# Patient Record
Sex: Female | Born: 1970 | Race: White | Hispanic: No | Smoking: Current every day smoker
Health system: Southern US, Community
[De-identification: ages and names within clinical notes are randomized; demographics above are authoritative.]

## PROBLEM LIST (undated history)

## (undated) DIAGNOSIS — M1990 Unspecified osteoarthritis, unspecified site: Secondary | ICD-10-CM

## (undated) HISTORY — PX: TOTAL HIP ARTHROPLASTY: SHX124

## (undated) HISTORY — PX: JOINT REPLACEMENT: SHX530

## (undated) HISTORY — PX: TUBAL LIGATION: SHX77

---

## 2000-04-21 ENCOUNTER — Encounter: Payer: Self-pay | Admitting: Emergency Medicine

## 2000-04-21 ENCOUNTER — Emergency Department (HOSPITAL_COMMUNITY): Admission: EM | Admit: 2000-04-21 | Discharge: 2000-04-22 | Payer: Self-pay | Admitting: Emergency Medicine

## 2000-11-30 ENCOUNTER — Encounter: Payer: Self-pay | Admitting: *Deleted

## 2000-11-30 ENCOUNTER — Inpatient Hospital Stay (HOSPITAL_COMMUNITY): Admission: AD | Admit: 2000-11-30 | Discharge: 2000-11-30 | Payer: Self-pay | Admitting: *Deleted

## 2000-12-20 ENCOUNTER — Encounter: Payer: Self-pay | Admitting: *Deleted

## 2000-12-20 ENCOUNTER — Inpatient Hospital Stay (HOSPITAL_COMMUNITY): Admission: AD | Admit: 2000-12-20 | Discharge: 2000-12-20 | Payer: Self-pay | Admitting: Obstetrics

## 2001-01-23 ENCOUNTER — Inpatient Hospital Stay (HOSPITAL_COMMUNITY): Admission: AD | Admit: 2001-01-23 | Discharge: 2001-01-23 | Payer: Self-pay | Admitting: Obstetrics

## 2001-01-23 ENCOUNTER — Encounter: Admission: RE | Admit: 2001-01-23 | Discharge: 2001-01-23 | Payer: Self-pay | Admitting: Obstetrics

## 2001-01-27 ENCOUNTER — Encounter: Payer: Self-pay | Admitting: Obstetrics

## 2001-01-27 ENCOUNTER — Ambulatory Visit (HOSPITAL_COMMUNITY): Admission: RE | Admit: 2001-01-27 | Discharge: 2001-01-27 | Payer: Self-pay | Admitting: Obstetrics

## 2001-01-30 ENCOUNTER — Encounter (HOSPITAL_COMMUNITY): Admission: AD | Admit: 2001-01-30 | Discharge: 2001-02-07 | Payer: Self-pay | Admitting: Obstetrics

## 2001-01-30 ENCOUNTER — Encounter: Admission: RE | Admit: 2001-01-30 | Discharge: 2001-01-30 | Payer: Self-pay | Admitting: Obstetrics

## 2001-02-03 ENCOUNTER — Inpatient Hospital Stay (HOSPITAL_COMMUNITY): Admission: AD | Admit: 2001-02-03 | Discharge: 2001-02-03 | Payer: Self-pay | Admitting: Obstetrics

## 2001-02-05 ENCOUNTER — Inpatient Hospital Stay (HOSPITAL_COMMUNITY): Admission: AD | Admit: 2001-02-05 | Discharge: 2001-02-05 | Payer: Self-pay | Admitting: Obstetrics

## 2001-02-06 ENCOUNTER — Encounter: Admission: RE | Admit: 2001-02-06 | Discharge: 2001-02-06 | Payer: Self-pay | Admitting: Obstetrics

## 2001-02-06 ENCOUNTER — Inpatient Hospital Stay (HOSPITAL_COMMUNITY): Admission: AD | Admit: 2001-02-06 | Discharge: 2001-02-08 | Payer: Self-pay | Admitting: Obstetrics & Gynecology

## 2008-10-28 ENCOUNTER — Encounter: Admission: RE | Admit: 2008-10-28 | Discharge: 2008-10-28 | Payer: Self-pay | Admitting: Family Medicine

## 2009-03-22 ENCOUNTER — Emergency Department (HOSPITAL_BASED_OUTPATIENT_CLINIC_OR_DEPARTMENT_OTHER): Admission: EM | Admit: 2009-03-22 | Discharge: 2009-03-22 | Payer: Self-pay | Admitting: Emergency Medicine

## 2009-04-25 ENCOUNTER — Ambulatory Visit: Payer: Self-pay | Admitting: Diagnostic Radiology

## 2009-04-25 ENCOUNTER — Emergency Department (HOSPITAL_BASED_OUTPATIENT_CLINIC_OR_DEPARTMENT_OTHER): Admission: EM | Admit: 2009-04-25 | Discharge: 2009-04-25 | Payer: Self-pay | Admitting: Emergency Medicine

## 2010-10-12 LAB — D-DIMER, QUANTITATIVE: D-Dimer, Quant: <0.22 ug/mL-FEU (ref 0.00–0.48)

## 2010-10-12 LAB — DIFFERENTIAL
Basophils Relative: 3 % — ABNORMAL HIGH (ref 0–1)
Eosinophils Relative: 0 % (ref 0–5)
Lymphs Abs: 2.6 10*3/uL (ref 0.7–4.0)
Monocytes Absolute: 0.7 10*3/uL (ref 0.1–1.0)
Monocytes Relative: 8 % (ref 3–12)
Smear Review: NORMAL

## 2010-10-12 LAB — BASIC METABOLIC PANEL
BUN: 6 mg/dL (ref 6–23)
Chloride: 112 mEq/L (ref 96–112)
GFR calc Af Amer: >60 mL/min (ref >60)
GFR calc non Af Amer: >60 mL/min (ref >60)
Potassium: 3.6 mEq/L (ref 3.5–5.1)
Sodium: 140 mEq/L (ref 135–145)

## 2010-10-12 LAB — CBC
HCT: 44.8 % (ref 36.0–46.0)
MCV: 96.1 fL (ref 78.0–100.0)
Platelets: 326 10*3/uL (ref 150–400)
RBC: 4.67 MIL/uL (ref 3.87–5.11)
WBC: 9.3 10*3/uL (ref 4.0–10.5)

## 2011-11-26 ENCOUNTER — Emergency Department
Admit: 2011-11-26 | Discharge: 2011-11-26 | Disposition: A | Discharge status: Home / Self Care | Payer: PRIVATE HEALTH INSURANCE

## 2011-11-26 ENCOUNTER — Emergency Department (INDEPENDENT_AMBULATORY_CARE_PROVIDER_SITE_OTHER)
Admission: EM | Admit: 2011-11-26 | Discharge: 2011-11-26 | Disposition: A | Discharge status: Home / Self Care | Payer: PRIVATE HEALTH INSURANCE | Source: Home / Self Care | Attending: Family Medicine | Admitting: Family Medicine

## 2011-11-26 ENCOUNTER — Encounter: Payer: Self-pay | Admitting: Emergency Medicine

## 2011-11-26 DIAGNOSIS — J45909 Unspecified asthma, uncomplicated: Secondary | ICD-10-CM

## 2011-11-26 MED ORDER — METHYLPREDNISOLONE SODIUM SUCC 125 MG IJ SOLR
125.0000 mg | Freq: Once | INTRAMUSCULAR | Status: AC
  Administered 2011-11-26: 125 mg via INTRAMUSCULAR

## 2011-11-26 MED ORDER — IPRATROPIUM-ALBUTEROL 0.5-2.5 (3) MG/3ML IN SOLN
3.0000 mL | Freq: Once | RESPIRATORY_TRACT | Status: AC
  Administered 2011-11-26: 3 mL via RESPIRATORY_TRACT

## 2011-11-26 MED ORDER — AMOXICILLIN 875 MG PO TABS: 875.0000 mg | ORAL_TABLET | Freq: Two times a day (BID) | ORAL | Status: AC

## 2011-11-26 MED ORDER — BENZONATATE 200 MG PO CAPS: 200.0000 mg | ORAL_CAPSULE | Freq: Every day | ORAL | Status: AC

## 2011-11-26 MED ORDER — PREDNISONE 20 MG PO TABS: 20.0000 mg | ORAL_TABLET | Freq: Two times a day (BID) | ORAL | Status: AC

## 2011-11-26 NOTE — ED Provider Notes (Addendum)
History     CSN: [id number]  Arrival date & time 11/26/11  1317   First MD Initiated Contact with Patient 11/26/11 1339      Chief Complaint  Patient presents with  . Wheezing      HPI Comments: Patient complains of approximately 4 day history of gradually progressive URI symptoms beginning with   progressive nasal congestion and a cough.  Complains of fatigue but no myalgias.  Cough is now worse at night and generally productive during the day.  There has been no pleuritic pain but she complains of wheezing and shortness of breath with activity.  She started taking some left-over amoxicillin 500mg  without improvement.  She has a history of asthma but normally only needs her albuterol MDI about 1-2 times per week.  She normally does not cough at night.  She has a past history of  Pneumonia several years ago.  She smokes 1/2 pack per day     The history is provided by the patient.    Past Medical History  Diagnosis Date  . Asthma     Past Surgical History  Procedure Date  . Total hip arthroplasty     History reviewed. No pertinent family history.  History  Substance Use Topics  . Smoking status: Current Everyday Smoker -- 0.5 packs/day for 20 years  . Smokeless tobacco: Not on file  . Alcohol Use: No    OB History    Grav Para Term Preterm Abortions TAB SAB Ect Mult Living                  Review of Systems No sore throat + cough No pleuritic pain + wheezing + nasal congestion + post-nasal drainage No sinus pain/pressure No itchy/red eyes No earache No hemoptysis + SOB with activity + low grade fever/chills No nausea No vomiting No abdominal pain No diarrhea No urinary symptoms No skin rashes + fatigue No myalgias No headache Used OTC meds without relief  Allergies  Biaxin  Home Medications   Current Outpatient Rx  Name Route Sig Dispense Refill  . ALBUTEROL SULFATE (2.5 MG/3ML) 0.083% IN NEBU Nebulization Take 2.5 mg by nebulization every 6  (six) hours as needed.    . BUDESONIDE-FORMOTEROL FUMARATE 160-4.5 MCG/ACT IN AERO Inhalation Inhale 2 puffs into the lungs 2 (two) times daily.    Marland Kitchen GABAPENTIN 300 MG PO CAPS Oral Take 300 mg by mouth 3 (three) times daily.    . AMOXICILLIN 875 MG PO TABS Oral Take 1 tablet (875 mg total) by mouth 2 (two) times daily. 14 tablet 0  . BENZONATATE 200 MG PO CAPS Oral Take 1 capsule (200 mg total) by mouth at bedtime. Take as needed for cough 12 capsule 0  . PREDNISONE 20 MG PO TABS Oral Take 1 tablet (20 mg total) by mouth 2 (two) times daily. 10 tablet 0    BP 110/79  Pulse 82  Temp(Src) 98.3 F (36.8 C) (Oral)  Resp 24  Ht 5\' 5"  (1.651 m)  Wt 122 lb (55.339 kg)  BMI 20.30 kg/m2  SpO2 96%  LMP 11/26/2011  Physical Exam Nursing notes and Vital Signs reviewed. Appearance:  Patient appears healthy, stated age, and in no acute distress Eyes:  Pupils are equal, round, and reactive to light and accomodation.  Extraocular movement is intact.  Conjunctivae are not inflamed  Ears:  Canals normal.  Tympanic membranes normal.  Nose:  Mildly congested turbinates.  No sinus tenderness.   Pharynx:  Normal  Neck:  Supple.  Slightly tender shotty posterior nodes are palpated bilaterally  Lungs:  Diffuse wheezes posteriorly.  Breath sounds are equal.  Heart:  Regular rate and rhythm without murmurs, rubs, or gallops.  Abdomen:  Nontender without masses or hepatosplenomegaly.  Bowel sounds are present.  No CVA or flank tenderness.  Extremities:  No edema.  No calf tenderness Skin:  No rash present.   ED Course  Procedures none   Dg Chest 2 View  11/26/2011  *RADIOLOGY REPORT*  Clinical Data: Asthma, cough, wheezing  CHEST - 2 VIEW  Comparison: 04/25/2009  Findings: Hyperinflation again noted.  Cardiomediastinal silhouette is stable.  No acute infiltrate or pleural effusion.  No pulmonary edema.  Bony thorax is stable with  IMPRESSION: No active disease.  Hyperinflation again noted.  Original Report  Authenticated By: Natasha Mead, M.D.     1. Asthmatic bronchitis   Suspect intermittent asthma.    MDM  Given nebulizer treatment with DuoNeb.  Note peak flow 175 post neb treatment Solumedrol 125mg  IM Begin prednisone burst for 5 days.  Continue amoxicillin.  Prescription written for Benzonatate [facility]) to take at bedtime for night-time cough.  Start prednisone on Tuesday 11/27/11. Take plain Mucinex (guaifenesin) twice daily for cough and congestion.  Increase fluid intake, rest. May use Afrin nasal spray (or generic oxymetazoline) twice daily for about 5 days.  Also recommend using saline nasal spray several times daily and saline nasal irrigation (AYR is a common brand) Stop all antihistamines for now, and other non-prescription cough/cold preparations. Continue inhalers. Recommend discontinuing smoking.  Followup with Family Doctor if not improved in 5 days.  Followup with PCP to establish an asthma plan         Lattie Haw, MD 11/27/11 1411  Lattie Haw, MD 11/27/11 [phone]

## 2011-11-26 NOTE — ED Notes (Signed)
Peak flow post Neb: 175

## 2011-11-26 NOTE — ED Notes (Signed)
Wheezing, congestion, cough, runny nose x 4 days

## 2011-11-26 NOTE — Discharge Instructions (Signed)
Start prednisone on Tuesday 11/27/11. Take plain Mucinex (guaifenesin) twice daily for cough and congestion.  Increase fluid intake, rest. May use Afrin nasal spray (or generic oxymetazoline) twice daily for about 5 days.  Also recommend using saline nasal spray several times daily and saline nasal irrigation (AYR is a common brand) Stop all antihistamines for now, and other non-prescription cough/cold preparations. Continue inhalers. Recommend discontinuing smoking.

## 2012-04-29 ENCOUNTER — Encounter (HOSPITAL_BASED_OUTPATIENT_CLINIC_OR_DEPARTMENT_OTHER): Payer: Self-pay | Admitting: *Deleted

## 2012-04-29 ENCOUNTER — Emergency Department (HOSPITAL_BASED_OUTPATIENT_CLINIC_OR_DEPARTMENT_OTHER)
Admission: EM | Admit: 2012-04-29 | Discharge: 2012-04-29 | Disposition: A | Discharge status: Home / Self Care | Payer: Self-pay | Attending: Emergency Medicine | Admitting: Emergency Medicine

## 2012-04-29 ENCOUNTER — Emergency Department (HOSPITAL_BASED_OUTPATIENT_CLINIC_OR_DEPARTMENT_OTHER): Payer: Self-pay

## 2012-04-29 DIAGNOSIS — M25559 Pain in unspecified hip: Secondary | ICD-10-CM | POA: Insufficient documentation

## 2012-04-29 DIAGNOSIS — M25552 Pain in left hip: Secondary | ICD-10-CM

## 2012-04-29 DIAGNOSIS — F17200 Nicotine dependence, unspecified, uncomplicated: Secondary | ICD-10-CM | POA: Insufficient documentation

## 2012-04-29 DIAGNOSIS — J45909 Unspecified asthma, uncomplicated: Secondary | ICD-10-CM | POA: Insufficient documentation

## 2012-04-29 DIAGNOSIS — Z79899 Other long term (current) drug therapy: Secondary | ICD-10-CM | POA: Insufficient documentation

## 2012-04-29 DIAGNOSIS — Z96649 Presence of unspecified artificial hip joint: Secondary | ICD-10-CM | POA: Insufficient documentation

## 2012-04-29 MED ORDER — OXYCODONE-ACETAMINOPHEN 5-325 MG PO TABS: 2.0000 | ORAL_TABLET | ORAL | Status: DC | PRN

## 2012-04-29 NOTE — ED Provider Notes (Signed)
History     CSN: [id number]  Arrival date & time 04/29/12  1129   First MD Initiated Contact with Patient 04/29/12 1237      Chief Complaint  Patient presents with  . Hip Pain    (Consider location/radiation/quality/duration/timing/severity/associated sxs/prior treatment) The history is provided by the patient.  Jenna Adams is a 41 y.o. 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 She noticed that the L hip popped out 3 days ago and she was able to pop it back in. Afterwards, it felt more unstable but she is still able to bear weight on it. No weakness or pain. No falls.    Past Medical History  Diagnosis Date  . Asthma     Past Surgical History  Procedure Date  . Total hip arthroplasty     History reviewed. No pertinent family history.  History  Substance Use Topics  . Smoking status: Current Every Day Smoker -- 0.5 packs/day for 20 years  . Smokeless tobacco: Not on file  . Alcohol Use: No    OB History    Grav Para Term Preterm Abortions TAB SAB Ect Mult Living                  Review of Systems  Musculoskeletal:       L hip pain  All other systems reviewed and are negative.    Allergies  Biaxin  Home Medications   Current Outpatient Rx  Name Route Sig Dispense Refill  . ALBUTEROL SULFATE (2.5 MG/3ML) 0.083% IN NEBU Nebulization Take 2.5 mg by nebulization every 6 (six) hours as needed.    . BUDESONIDE-FORMOTEROL FUMARATE 160-4.5 MCG/ACT IN AERO Inhalation Inhale 2 puffs into the lungs 2 (two) times daily.    Marland Kitchen GABAPENTIN 300 MG PO CAPS Oral Take 300 mg by mouth 3 (three) times daily.    . OXYCODONE-ACETAMINOPHEN 5-325 MG PO TABS Oral Take 2 tablets by mouth every 4 (four) hours as needed for pain. 15 tablet 0    BP 101/59  Pulse 87  Temp 98.1 F (36.7 C) (Oral)  Resp 18  Ht 5\' 5"  (1.651 m)  Wt 132 lb (59.875 kg)  BMI 21.97 kg/m2  SpO2 99%  LMP 04/15/2012  Physical Exam  Nursing note and vitals  reviewed. Constitutional: She is oriented to person, place, and time. She appears well-developed and well-nourished.  HENT:  Head: Normocephalic.  Eyes: Conjunctivae normal and EOM are normal. Pupils are equal, round, and reactive to light.  Neck: Normal range of motion. Neck supple.  Cardiovascular: Normal rate.   Pulmonary/Chest: Effort normal.  Abdominal: Soft.  Musculoskeletal: Normal range of motion.       L hip in good alignment, nl ROM of hip joint. Able to bear weight on it. 2+ pulses, nl sensation. Nl motor movement.   Neurological: She is alert and oriented to person, place, and time.  Skin: Skin is warm and dry.  Psychiatric: She has a normal mood and affect. Her behavior is normal. Judgment and thought content normal.    ED Course  Procedures (including critical care time)  Labs Reviewed - No data to display Dg Hip Complete Left  04/29/2012  *RADIOLOGY REPORT*  Clinical Data: Hip dislocation  LEFT HIP - COMPLETE 2+ VIEW  Comparison: None.  Findings: Three views of the left hip submitted.  There is left hip prosthesis  in anatomic alignment.  No evidence of prosthesis loosening.  IMPRESSION: Left hip  prosthesis in anatomic alignment.   Original Report Authenticated By: Natasha Mead, M.D.      1. Hip pain, left       MDM  Jenna Adams is a 41 y.o. female here with L hip pain. Xray showed good alignment. I discussed with patient at length regarding follow up. The surgery was done at [facility]. I recommend that she goes back to [facility] for follow up or see an orthopedic doctor here. I gave her the oncall orthopedic specialist today to f/u. Return precautions given.          Richardean Canal, MD 04/29/12 1255

## 2012-04-29 NOTE — ED Notes (Signed)
Patient transported to X-ray 

## 2012-04-29 NOTE — ED Notes (Signed)
Pt returned from radiology.

## 2012-04-29 NOTE — ED Notes (Signed)
Had left hip replacement 3 yrs ago on Sunday it popped out and was able to get it back in place since then doesn't feel stable

## 2012-06-24 ENCOUNTER — Encounter (HOSPITAL_BASED_OUTPATIENT_CLINIC_OR_DEPARTMENT_OTHER): Payer: Self-pay

## 2012-06-24 ENCOUNTER — Emergency Department (HOSPITAL_BASED_OUTPATIENT_CLINIC_OR_DEPARTMENT_OTHER)
Admission: EM | Admit: 2012-06-24 | Discharge: 2012-06-24 | Disposition: A | Discharge status: Home / Self Care | Payer: PRIVATE HEALTH INSURANCE | Attending: Emergency Medicine | Admitting: Emergency Medicine

## 2012-06-24 DIAGNOSIS — R1110 Vomiting, unspecified: Secondary | ICD-10-CM | POA: Insufficient documentation

## 2012-06-24 DIAGNOSIS — Z9851 Tubal ligation status: Secondary | ICD-10-CM | POA: Insufficient documentation

## 2012-06-24 DIAGNOSIS — Z881 Allergy status to other antibiotic agents status: Secondary | ICD-10-CM | POA: Insufficient documentation

## 2012-06-24 DIAGNOSIS — IMO0002 Reserved for concepts with insufficient information to code with codable children: Secondary | ICD-10-CM | POA: Insufficient documentation

## 2012-06-24 DIAGNOSIS — Z96649 Presence of unspecified artificial hip joint: Secondary | ICD-10-CM | POA: Insufficient documentation

## 2012-06-24 DIAGNOSIS — F17200 Nicotine dependence, unspecified, uncomplicated: Secondary | ICD-10-CM | POA: Insufficient documentation

## 2012-06-24 DIAGNOSIS — R197 Diarrhea, unspecified: Secondary | ICD-10-CM | POA: Insufficient documentation

## 2012-06-24 DIAGNOSIS — J45909 Unspecified asthma, uncomplicated: Secondary | ICD-10-CM | POA: Insufficient documentation

## 2012-06-24 DIAGNOSIS — Z79899 Other long term (current) drug therapy: Secondary | ICD-10-CM | POA: Insufficient documentation

## 2012-06-24 LAB — URINALYSIS, ROUTINE W REFLEX MICROSCOPIC
Hgb urine dipstick: NEGATIVE
Nitrite: NEGATIVE
Specific Gravity, Urine: 1.03 (ref 1.005–1.030)
Urobilinogen, UA: 1 mg/dL (ref 0.0–1.0)
pH: 6 (ref 5.0–8.0)

## 2012-06-24 MED ORDER — ONDANSETRON 8 MG PO TBDP: 8.0000 mg | ORAL_TABLET | Freq: Three times a day (TID) | ORAL | Status: DC | PRN

## 2012-06-24 NOTE — ED Provider Notes (Signed)
History     CSN: [id number]  Arrival date & time 06/24/12  1404   First MD Initiated Contact with Patient 06/24/12 1431      Chief Complaint  Patient presents with  . Fever    (Consider location/radiation/quality/duration/timing/severity/associated sxs/prior treatment) Patient is a 41 y.o. female presenting with fever. The history is provided by the patient. No language interpreter was used.  Fever Primary symptoms of the febrile illness include fever, vomiting and diarrhea. Primary symptoms do not include cough. The current episode started today. This is a new problem. The problem has been gradually worsening.  The vomiting began more than 2 days ago. Vomiting occurred once. The emesis contains stomach contents. Risk factors: none.  Associated with: fever. Risk factors: none. Pt complains of one episode of vomiting today.  Pt reports he has had diarrhea  Past Medical History  Diagnosis Date  . Asthma     Past Surgical History  Procedure Date  . Total hip arthroplasty   . Tubal ligation     No family history on file.  History  Substance Use Topics  . Smoking status: Current Every Day Smoker -- 0.5 packs/day for 20 years  . Smokeless tobacco: Not on file  . Alcohol Use: No    OB History    Grav Para Term Preterm Abortions TAB SAB Ect Mult Living                  Review of Systems  Constitutional: Positive for fever.  Respiratory: Negative for cough.   Gastrointestinal: Positive for vomiting and diarrhea.  All other systems reviewed and are negative.    Allergies  Biaxin  Home Medications   Current Outpatient Rx  Name  Route  Sig  Dispense  Refill  . ALBUTEROL SULFATE (2.5 MG/3ML) 0.083% IN NEBU   Nebulization   Take 2.5 mg by nebulization every 6 (six) hours as needed.         . BUDESONIDE-FORMOTEROL FUMARATE 160-4.5 MCG/ACT IN AERO   Inhalation   Inhale 2 puffs into the lungs 2 (two) times daily.         Marland Kitchen GABAPENTIN 300 MG PO CAPS   Oral   Take 300 mg by mouth 3 (three) times daily.         . OXYCODONE-ACETAMINOPHEN 5-325 MG PO TABS   Oral   Take 2 tablets by mouth every 4 (four) hours as needed for pain.   15 tablet   0     BP 102/75  Pulse 75  Temp 98.1 F (36.7 C) (Oral)  Resp 16  Ht 5\' 5"  (1.651 m)  Wt 130 lb (58.968 kg)  BMI 21.63 kg/m2  SpO2 98%  LMP 06/02/2012  Physical Exam  Nursing note and vitals reviewed. Constitutional: She appears well-developed and well-nourished.  HENT:  Head: Normocephalic and atraumatic.  Right Ear: External ear normal.  Eyes: Conjunctivae normal and EOM are normal. Pupils are equal, round, and reactive to light.  Neck: Normal range of motion. Neck supple.  Cardiovascular: Normal rate.   Pulmonary/Chest: Effort normal.  Abdominal: Soft.  Musculoskeletal: Normal range of motion.  Neurological: She is alert.  Skin: Skin is warm.    ED Course  Procedures (including critical care time)  Labs Reviewed  URINALYSIS, ROUTINE W REFLEX MICROSCOPIC - Abnormal; Notable for the following:    Color, Urine AMBER (*)  BIOCHEMICALS MAY BE AFFECTED BY COLOR   APPearance CLOUDY (*)     Bilirubin Urine SMALL (*)  All other components within normal limits   No results found.   No diagnosis found.    MDM  Pt advised to drink plenty of liquids,  zofran odt and imodium        Lonia Skinner [place], [place] 06/24/12 1512  Lonia Skinner [place], [place] 06/24/12 1513

## 2012-06-24 NOTE — ED Notes (Signed)
C/o fever, v/d x 5 days

## 2012-06-26 NOTE — ED Provider Notes (Signed)
Medical screening examination/treatment/procedure(s) were performed by non-physician practitioner and as supervising physician I was immediately available for consultation/collaboration.  Glynn Octave, MD 06/26/12 [phone]

## 2014-03-31 ENCOUNTER — Emergency Department (HOSPITAL_BASED_OUTPATIENT_CLINIC_OR_DEPARTMENT_OTHER)
Admission: EM | Admit: 2014-03-31 | Discharge: 2014-03-31 | Disposition: A | Discharge status: Home / Self Care | Payer: Self-pay | Attending: Emergency Medicine | Admitting: Emergency Medicine

## 2014-03-31 ENCOUNTER — Encounter (HOSPITAL_BASED_OUTPATIENT_CLINIC_OR_DEPARTMENT_OTHER): Payer: Self-pay | Admitting: Emergency Medicine

## 2014-03-31 ENCOUNTER — Emergency Department (HOSPITAL_BASED_OUTPATIENT_CLINIC_OR_DEPARTMENT_OTHER): Payer: PRIVATE HEALTH INSURANCE

## 2014-03-31 DIAGNOSIS — F17200 Nicotine dependence, unspecified, uncomplicated: Secondary | ICD-10-CM | POA: Insufficient documentation

## 2014-03-31 DIAGNOSIS — R062 Wheezing: Secondary | ICD-10-CM | POA: Insufficient documentation

## 2014-03-31 DIAGNOSIS — IMO0002 Reserved for concepts with insufficient information to code with codable children: Secondary | ICD-10-CM | POA: Insufficient documentation

## 2014-03-31 DIAGNOSIS — Z79899 Other long term (current) drug therapy: Secondary | ICD-10-CM | POA: Insufficient documentation

## 2014-03-31 DIAGNOSIS — J45901 Unspecified asthma with (acute) exacerbation: Secondary | ICD-10-CM | POA: Insufficient documentation

## 2014-03-31 MED ORDER — ALBUTEROL SULFATE (2.5 MG/3ML) 0.083% IN NEBU
INHALATION_SOLUTION | RESPIRATORY_TRACT | Status: AC
  Administered 2014-03-31: 2.5 mg
  Filled 2014-03-31: qty 3

## 2014-03-31 MED ORDER — PREDNISONE 50 MG PO TABS
60.0000 mg | ORAL_TABLET | Freq: Once | ORAL | Status: AC
  Administered 2014-03-31: 60 mg via ORAL
  Filled 2014-03-31 (×2): qty 1

## 2014-03-31 MED ORDER — IPRATROPIUM-ALBUTEROL 0.5-2.5 (3) MG/3ML IN SOLN
RESPIRATORY_TRACT | Status: AC
  Administered 2014-03-31: 3 mL
  Filled 2014-03-31: qty 3

## 2014-03-31 MED ORDER — PREDNISONE 10 MG PO TABS: 60.0000 mg | ORAL_TABLET | Freq: Every day | ORAL | Status: DC

## 2014-03-31 NOTE — ED Notes (Signed)
Patient here with increased wheezing and congestion since last pm, positive smoker. Used inhaler with minimal relief

## 2014-03-31 NOTE — Discharge Instructions (Signed)

## 2014-03-31 NOTE — ED Provider Notes (Signed)
CSN: [id number]     Arrival date & time 03/31/14  4098 History   First MD Initiated Contact with Patient 03/31/14 1148     Chief Complaint  Patient presents with  . Wheezing     (Consider location/radiation/quality/duration/timing/severity/associated sxs/prior Treatment) Patient is a 43 y.o. female presenting with wheezing.  Wheezing Severity:  Severe Severity compared to prior episodes:  Similar Onset quality:  Gradual Duration:  12 hours Timing:  Constant Progression:  Resolved Chronicity:  Recurrent Context comment:  Allergies acting up over past few days Relieved by: nebulizer treatment given prior to my eval by protocol. Worsened by:  Nothing tried Ineffective treatments:  Beta-agonist inhaler Associated symptoms: chest tightness, cough (dry) and shortness of breath   Associated symptoms: no fever and no sputum production     Past Medical History  Diagnosis Date  . Asthma    Past Surgical History  Procedure Laterality Date  . Total hip arthroplasty    . Tubal ligation     No family history on file. History  Substance Use Topics  . Smoking status: Current Every Day Smoker -- 0.50 packs/day for 20 years  . Smokeless tobacco: Not on file  . Alcohol Use: No   OB History   Grav Para Term Preterm Abortions TAB SAB Ect Mult Living                 Review of Systems  Constitutional: Negative for fever.  Respiratory: Positive for cough (dry), chest tightness, shortness of breath and wheezing. Negative for sputum production.   All other systems reviewed and are negative.     Allergies  Biaxin  Home Medications   Prior to Admission medications   Medication Sig Start Date End Date Taking? Authorizing Provider  albuterol (PROVENTIL) (2.5 MG/3ML) 0.083% nebulizer solution Take 2.5 mg by nebulization every 6 (six) hours as needed.    Historical Provider, MD  budesonide-formoterol (SYMBICORT) 160-4.5 MCG/ACT inhaler Inhale 2 puffs into the lungs 2 (two) times  daily.    Historical Provider, MD  gabapentin (NEURONTIN) 300 MG capsule Take 300 mg by mouth 3 (three) times daily.    Historical Provider, MD   BP 105/67  Pulse 78  Temp(Src) 98.4 F (36.9 C) (Oral)  Resp 20  Ht  (1.651 m)  Wt 170 lb (77.111 kg)  BMI 28.29 kg/m2  SpO2 96% Physical Exam  Nursing note and vitals reviewed. Constitutional: She is oriented to person, place, and time. She appears well-developed and well-nourished. No distress.  HENT:  Head: Normocephalic and atraumatic.  Mouth/Throat: Oropharynx is clear and moist.  Eyes: Conjunctivae are normal. Pupils are equal, round, and reactive to light. No scleral icterus.  Neck: Neck supple.  Cardiovascular: Normal rate, regular rhythm, normal heart sounds and intact distal pulses.   No murmur heard. Pulmonary/Chest: Effort normal. No stridor. No respiratory distress. She has wheezes (rare faint wheeze in left lung). She has no rales.  Good air movement.  Abdominal: Soft. Bowel sounds are normal. She exhibits no distension. There is no tenderness.  Musculoskeletal: Normal range of motion.  Neurological: She is alert and oriented to person, place, and time.  Skin: Skin is warm and dry. No rash noted.  Psychiatric: She has a normal mood and affect. Her behavior is normal.    ED Course  Procedures (including critical care time) Labs Review Labs Reviewed - No data to display  Imaging Review Dg Chest 2 View  03/31/2014   CLINICAL DATA:  Cough.  Wheezing.  Shortness of breath.  Asthma.  EXAM: CHEST  2 VIEW  COMPARISON:  11/26/2011  FINDINGS: Airway thickening is present, suggesting bronchitis or reactive airways disease. No airspace opacity identified. Cardiac and mediastinal margins appear normal. No pleural effusion.  IMPRESSION: 1. Airway thickening is present, suggesting bronchitis or reactive airways disease.   Electronically Signed   By: Herbie Baltimore M.D.   On: 03/31/2014 12:25  All radiology studies independently  viewed by me.      EKG Interpretation None      MDM   Final diagnoses:  Asthma with acute exacerbation in adult    Pt says symptoms resolved after we gave her a neb treatment.  Presentation consistent with exacerbation of reactive airway disease.      Candyce Churn III, MD 03/31/14 1239

## 2014-03-31 NOTE — ED Notes (Signed)
Rt at bedside, administering aerosol treatment.

## 2014-05-17 ENCOUNTER — Emergency Department (HOSPITAL_BASED_OUTPATIENT_CLINIC_OR_DEPARTMENT_OTHER)
Admission: EM | Admit: 2014-05-17 | Discharge: 2014-05-17 | Disposition: A | Discharge status: Home / Self Care | Payer: PRIVATE HEALTH INSURANCE | Attending: Emergency Medicine | Admitting: Emergency Medicine

## 2014-05-17 ENCOUNTER — Encounter (HOSPITAL_BASED_OUTPATIENT_CLINIC_OR_DEPARTMENT_OTHER): Payer: Self-pay | Admitting: *Deleted

## 2014-05-17 ENCOUNTER — Emergency Department (HOSPITAL_BASED_OUTPATIENT_CLINIC_OR_DEPARTMENT_OTHER): Payer: PRIVATE HEALTH INSURANCE

## 2014-05-17 DIAGNOSIS — Z7952 Long term (current) use of systemic steroids: Secondary | ICD-10-CM | POA: Insufficient documentation

## 2014-05-17 DIAGNOSIS — Z79899 Other long term (current) drug therapy: Secondary | ICD-10-CM | POA: Insufficient documentation

## 2014-05-17 DIAGNOSIS — J45909 Unspecified asthma, uncomplicated: Secondary | ICD-10-CM

## 2014-05-17 DIAGNOSIS — J45901 Unspecified asthma with (acute) exacerbation: Secondary | ICD-10-CM | POA: Insufficient documentation

## 2014-05-17 DIAGNOSIS — Z7951 Long term (current) use of inhaled steroids: Secondary | ICD-10-CM | POA: Insufficient documentation

## 2014-05-17 DIAGNOSIS — Z720 Tobacco use: Secondary | ICD-10-CM | POA: Insufficient documentation

## 2014-05-17 MED ORDER — IPRATROPIUM-ALBUTEROL 0.5-2.5 (3) MG/3ML IN SOLN
RESPIRATORY_TRACT | Status: AC
  Filled 2014-05-17: qty 3

## 2014-05-17 MED ORDER — PREDNISONE 20 MG PO TABS: 40.0000 mg | ORAL_TABLET | Freq: Two times a day (BID) | ORAL | Status: DC

## 2014-05-17 MED ORDER — ALBUTEROL SULFATE (2.5 MG/3ML) 0.083% IN NEBU
2.5000 mg | INHALATION_SOLUTION | Freq: Once | RESPIRATORY_TRACT | Status: AC
  Administered 2014-05-17: 2.5 mg via RESPIRATORY_TRACT

## 2014-05-17 MED ORDER — ALBUTEROL SULFATE (2.5 MG/3ML) 0.083% IN NEBU
INHALATION_SOLUTION | RESPIRATORY_TRACT | Status: AC
  Filled 2014-05-17: qty 3

## 2014-05-17 MED ORDER — IPRATROPIUM-ALBUTEROL 0.5-2.5 (3) MG/3ML IN SOLN
3.0000 mL | Freq: Once | RESPIRATORY_TRACT | Status: AC
  Administered 2014-05-17: 3 mL via RESPIRATORY_TRACT
  Filled 2014-05-17: qty 3

## 2014-05-17 MED ORDER — ALBUTEROL SULFATE HFA 108 (90 BASE) MCG/ACT IN AERS
2.0000 | INHALATION_SPRAY | Freq: Once | RESPIRATORY_TRACT | Status: AC
  Administered 2014-05-17: 2 via RESPIRATORY_TRACT
  Filled 2014-05-17: qty 6.7

## 2014-05-17 MED ORDER — PREDNISONE 50 MG PO TABS
60.0000 mg | ORAL_TABLET | Freq: Once | ORAL | Status: AC
  Administered 2014-05-17: 60 mg via ORAL
  Filled 2014-05-17 (×2): qty 1

## 2014-05-17 MED ORDER — IPRATROPIUM-ALBUTEROL 0.5-2.5 (3) MG/3ML IN SOLN
3.0000 mL | Freq: Once | RESPIRATORY_TRACT | Status: AC
  Administered 2014-05-17: 3 mL via RESPIRATORY_TRACT

## 2014-05-17 NOTE — ED Provider Notes (Signed)
CSN: [id number]     Arrival date & time 05/17/14  1126 History   First MD Initiated Contact with Patient 05/17/14 1141     Chief Complaint  Patient presents with  . Asthma     (Consider location/radiation/quality/duration/timing/severity/associated sxs/prior Treatment) HPI Comments: Pt states that she is here for an asthma attack. States that the symptoms started last night but worsened today. States that she has had productive cough. She states that she has this happen a couple of episodes a year. She doesn't have fever. Used inhaler with only partial relief  No language interpreter was used.    Past Medical History  Diagnosis Date  . Asthma    Past Surgical History  Procedure Laterality Date  . Total hip arthroplasty    . Tubal ligation     No family history on file. History  Substance Use Topics  . Smoking status: Current Every Day Smoker -- 0.50 packs/day for 20 years  . Smokeless tobacco: Not on file  . Alcohol Use: No   OB History    No data available     Review of Systems  All other systems reviewed and are negative.     Allergies  Biaxin  Home Medications   Prior to Admission medications   Medication Sig Start Date End Date Taking? Authorizing Provider  albuterol (PROVENTIL) (2.5 MG/3ML) 0.083% nebulizer solution Take 2.5 mg by nebulization every 6 (six) hours as needed.    Historical Provider, MD  budesonide-formoterol (SYMBICORT) 160-4.5 MCG/ACT inhaler Inhale 2 puffs into the lungs 2 (two) times daily.    Historical Provider, MD  gabapentin (NEURONTIN) 300 MG capsule Take 300 mg by mouth 3 (three) times daily.    Historical Provider, MD  predniSONE (DELTASONE) 10 MG tablet Take 6 tablets (60 mg total) by mouth daily. 04/01/14   Warnell Foresterrey Wofford, MD   BP 131/66 mmHg  Pulse 97  Temp(Src) 97.8 F (36.6 C) (Oral)  Resp 20  Ht 5\' 5"  (1.651 m)  Wt 170 lb (77.111 kg)  BMI 28.29 kg/m2  SpO2 99%  LMP 04/21/2014 Physical Exam  Constitutional: She is  oriented to person, place, and time. She appears well-developed and well-nourished.  HENT:  Head: Normocephalic and atraumatic.  Right Ear: External ear normal.  Left Ear: External ear normal.  Eyes: Conjunctivae and EOM are normal. Pupils are equal, round, and reactive to light.  Cardiovascular: Normal rate and regular rhythm.   Pulmonary/Chest: She has wheezes.  Musculoskeletal: Normal range of motion.  Neurological: She is alert and oriented to person, place, and time.  Skin: Skin is warm and dry.  Psychiatric: She has a normal mood and affect.  Nursing note and vitals reviewed.   ED Course  Procedures (including critical care time) Labs Review Labs Reviewed - No data to display  Imaging Review Dg Chest 2 View  05/17/2014   CLINICAL DATA:  Asthma and wheezing.  EXAM: CHEST  2 VIEW  COMPARISON:  03/31/2014  FINDINGS: Chronic diffuse bronchial wall thickening. There is no edema, consolidation, effusion, or pneumothorax. Normal heart size and mediastinal contours.  IMPRESSION: Chronic bronchitic changes.  No pneumonia or air leak.   Electronically Signed   By: Tiburcio PeaJonathan  Watts M.D.   On: 05/17/2014 12:26     EKG Interpretation None      MDM   Final diagnoses:  Asthma    No abnormality noted on xray. Will send home with prednisone and albuterol. Pt feeling better at this time    [place]  Rubin PayorPickering, NP 05/17/14 1311  Doug SouSam Jacubowitz, MD 05/17/14 1751

## 2014-05-17 NOTE — Discharge Instructions (Signed)

## 2014-05-17 NOTE — ED Notes (Signed)
Asthma attack. Sob started last night but worse this am.

## 2015-01-28 ENCOUNTER — Encounter (HOSPITAL_BASED_OUTPATIENT_CLINIC_OR_DEPARTMENT_OTHER): Payer: Self-pay

## 2015-01-28 ENCOUNTER — Emergency Department (HOSPITAL_BASED_OUTPATIENT_CLINIC_OR_DEPARTMENT_OTHER)
Admission: EM | Admit: 2015-01-28 | Discharge: 2015-01-28 | Disposition: A | Discharge status: Home / Self Care | Payer: Medicaid Other | Attending: Emergency Medicine | Admitting: Emergency Medicine

## 2015-01-28 DIAGNOSIS — Z7951 Long term (current) use of inhaled steroids: Secondary | ICD-10-CM | POA: Diagnosis not present

## 2015-01-28 DIAGNOSIS — R112 Nausea with vomiting, unspecified: Secondary | ICD-10-CM

## 2015-01-28 DIAGNOSIS — J45909 Unspecified asthma, uncomplicated: Secondary | ICD-10-CM | POA: Diagnosis not present

## 2015-01-28 DIAGNOSIS — Z720 Tobacco use: Secondary | ICD-10-CM | POA: Diagnosis not present

## 2015-01-28 DIAGNOSIS — R197 Diarrhea, unspecified: Secondary | ICD-10-CM | POA: Diagnosis not present

## 2015-01-28 DIAGNOSIS — N390 Urinary tract infection, site not specified: Secondary | ICD-10-CM | POA: Diagnosis not present

## 2015-01-28 DIAGNOSIS — Z79899 Other long term (current) drug therapy: Secondary | ICD-10-CM | POA: Insufficient documentation

## 2015-01-28 DIAGNOSIS — Z7952 Long term (current) use of systemic steroids: Secondary | ICD-10-CM | POA: Diagnosis not present

## 2015-01-28 LAB — URINALYSIS, ROUTINE W REFLEX MICROSCOPIC
BILIRUBIN URINE: NEGATIVE
Glucose, UA: NEGATIVE mg/dL
Hgb urine dipstick: NEGATIVE
Ketones, ur: NEGATIVE mg/dL
Nitrite: NEGATIVE
PH: 6.5 (ref 5.0–8.0)
PROTEIN: NEGATIVE mg/dL
SPECIFIC GRAVITY, URINE: 1.028 (ref 1.005–1.030)
UROBILINOGEN UA: 0.2 mg/dL (ref 0.0–1.0)

## 2015-01-28 LAB — URINE MICROSCOPIC-ADD ON

## 2015-01-28 LAB — COMPREHENSIVE METABOLIC PANEL
ALBUMIN: 4.5 g/dL (ref 3.5–5.0)
ALK PHOS: 50 U/L (ref 38–126)
ALT: 19 U/L (ref 14–54)
AST: 21 U/L (ref 15–41)
Anion gap: 6 (ref 5–15)
BUN: 18 mg/dL (ref 6–20)
CHLORIDE: 108 mmol/L (ref 101–111)
CO2: 26 mmol/L (ref 22–32)
CREATININE: 0.9 mg/dL (ref 0.44–1.00)
Calcium: 8.8 mg/dL — ABNORMAL LOW (ref 8.9–10.3)
GFR calc Af Amer: >60 mL/min (ref >60)
GLUCOSE: 146 mg/dL — AB (ref 65–99)
Potassium: 3.1 mmol/L — ABNORMAL LOW (ref 3.5–5.1)
SODIUM: 140 mmol/L (ref 135–145)
Total Bilirubin: 0.7 mg/dL (ref 0.3–1.2)
Total Protein: 7 g/dL (ref 6.5–8.1)

## 2015-01-28 LAB — CBC
HCT: 39.6 % (ref 36.0–46.0)
Hemoglobin: 13.2 g/dL (ref 12.0–15.0)
MCH: 32.6 pg (ref 26.0–34.0)
MCHC: 33.3 g/dL (ref 30.0–36.0)
MCV: 97.8 fL (ref 78.0–100.0)
Platelets: 309 10*3/uL (ref 150–400)
RBC: 4.05 MIL/uL (ref 3.87–5.11)
RDW: 12.6 % (ref 11.5–15.5)
WBC: 11.6 10*3/uL — ABNORMAL HIGH (ref 4.0–10.5)

## 2015-01-28 LAB — TROPONIN I

## 2015-01-28 LAB — LIPASE, BLOOD: LIPASE: 18 U/L — AB (ref 22–51)

## 2015-01-28 MED ORDER — SODIUM CHLORIDE 0.9 % IV BOLUS (SEPSIS)
500.0000 mL | Freq: Once | INTRAVENOUS | Status: AC
  Administered 2015-01-28: 500 mL via INTRAVENOUS

## 2015-01-28 MED ORDER — PROMETHAZINE HCL 12.5 MG RE SUPP: 12.5000 mg | Freq: Three times a day (TID) | RECTAL | Status: AC | PRN

## 2015-01-28 MED ORDER — DEXTROSE 5 % IV SOLN
1.0000 g | Freq: Once | INTRAVENOUS | Status: AC
  Administered 2015-01-28: 1 g via INTRAVENOUS

## 2015-01-28 MED ORDER — KETOROLAC TROMETHAMINE 30 MG/ML IJ SOLN
30.0000 mg | Freq: Once | INTRAMUSCULAR | Status: AC
Start: 2015-01-28 — End: 2015-01-28
  Administered 2015-01-28: 30 mg via INTRAVENOUS
  Filled 2015-01-28: qty 1

## 2015-01-28 MED ORDER — DICYCLOMINE HCL 10 MG/ML IM SOLN
20.0000 mg | Freq: Once | INTRAMUSCULAR | Status: AC
  Administered 2015-01-28: 20 mg via INTRAMUSCULAR
  Filled 2015-01-28: qty 2

## 2015-01-28 MED ORDER — CEFTRIAXONE SODIUM 1 G IJ SOLR
INTRAMUSCULAR | Status: AC
  Filled 2015-01-28: qty 10

## 2015-01-28 MED ORDER — PROMETHAZINE HCL 25 MG/ML IJ SOLN
12.5000 mg | Freq: Once | INTRAMUSCULAR | Status: AC
Start: 2015-01-28 — End: 2015-01-28
  Administered 2015-01-28: 12.5 mg via INTRAVENOUS
  Filled 2015-01-28: qty 1

## 2015-01-28 MED ORDER — ONDANSETRON HCL 4 MG/2ML IJ SOLN
4.0000 mg | Freq: Once | INTRAMUSCULAR | Status: AC | PRN
  Administered 2015-01-28: 4 mg via INTRAVENOUS
  Filled 2015-01-28: qty 2

## 2015-01-28 MED ORDER — NITROFURANTOIN MONOHYD MACRO 100 MG PO CAPS: 100.0000 mg | ORAL_CAPSULE | Freq: Two times a day (BID) | ORAL | Status: AC

## 2015-01-28 NOTE — ED Notes (Signed)
Dr. Nicanor Alcon in to see pt/family at [facility].

## 2015-01-28 NOTE — ED Notes (Signed)
Pt c/o n/v/d with fevers and chest pain x3 days

## 2015-01-28 NOTE — ED Provider Notes (Signed)
CSN: [id number]     Arrival date & time 01/28/15  0104 History   First MD Initiated Contact with Patient 01/28/15 0130     Chief Complaint  Patient presents with  . Emesis     (Consider location/radiation/quality/duration/timing/severity/associated sxs/prior Treatment) Patient is a 44 y.o. female presenting with vomiting. The history is provided by the patient and the spouse.  Emesis Severity:  Moderate Duration:  2 days Timing:  Intermittent Quality:  Stomach contents Progression:  Unchanged Chronicity:  New Recent urination:  Normal Context: not post-tussive   Relieved by:  Nothing Worsened by:  Nothing tried Ineffective treatments:  None tried Associated symptoms: diarrhea   Associated symptoms: no fever   Diarrhea:    Quality:  Watery   Severity:  Moderate   Timing:  Intermittent   Progression:  Unchanged Risk factors: sick contacts   Risk factors comment:  Grandson with same   Past Medical History  Diagnosis Date  . Asthma    Past Surgical History  Procedure Laterality Date  . Total hip arthroplasty    . Tubal ligation     History reviewed. No pertinent family history. History  Substance Use Topics  . Smoking status: Current Every Day Smoker -- 0.50 packs/day for 20 years  . Smokeless tobacco: Not on file  . Alcohol Use: No   OB History    No data available     Review of Systems  Constitutional: Negative for fever.  Respiratory: Negative for cough and shortness of breath.   Cardiovascular: Negative for chest pain, palpitations and leg swelling.  Gastrointestinal: Positive for vomiting and diarrhea. Negative for anal bleeding.  Genitourinary: Negative for dysuria and difficulty urinating.  Musculoskeletal: Negative for back pain.  All other systems reviewed and are negative.     Allergies  Biaxin  Home Medications   Prior to Admission medications   Medication Sig Start Date End Date Taking? Authorizing Provider  albuterol (PROVENTIL) (2.5  MG/3ML) 0.083% nebulizer solution Take 2.5 mg by nebulization every 6 (six) hours as needed.    Historical Provider, MD  budesonide-formoterol (SYMBICORT) 160-4.5 MCG/ACT inhaler Inhale 2 puffs into the lungs 2 (two) times daily.    Historical Provider, MD  gabapentin (NEURONTIN) 300 MG capsule Take 300 mg by mouth 3 (three) times daily.    Historical Provider, MD  predniSONE (DELTASONE) 20 MG tablet Take 2 tablets (40 mg total) by mouth 2 (two) times daily with a meal. 05/17/14   Teressa Lower, NP   BP 146/75 mmHg  Pulse 50  Temp(Src) 98.2 F (36.8 C) (Oral)  Resp 20  Ht 5\' 4"  (1.626 m)  Wt 150 lb (68.04 kg)  BMI 25.73 kg/m2  SpO2 97%  LMP 11/28/2014 Physical Exam  Constitutional: She is oriented to person, place, and time. She appears well-developed and well-nourished. No distress.  HENT:  Head: Normocephalic and atraumatic.  Mouth/Throat: Oropharynx is clear and moist.  Spider angiomas on B cheeks.    Eyes: Conjunctivae are normal. Pupils are equal, round, and reactive to light.  Neck: Normal range of motion. Neck supple.  Cardiovascular: Normal rate, regular rhythm and intact distal pulses.   Pulmonary/Chest: Effort normal and breath sounds normal. No respiratory distress. She has no wheezes. She has no rales. She exhibits no tenderness.  Abdominal: Soft. Bowel sounds are normal. There is no tenderness. There is no rebound and no guarding.  Musculoskeletal: Normal range of motion.  Neurological: She is alert and oriented to person, place, and time.  Skin:  Skin is warm and dry.  Psychiatric: She has a normal mood and affect.    ED Course  Procedures (including critical care time) Labs Review Labs Reviewed  LIPASE, BLOOD - Abnormal; Notable for the following:    Lipase 18 (*)    All other components within normal limits  COMPREHENSIVE METABOLIC PANEL - Abnormal; Notable for the following:    Potassium 3.1 (*)    Glucose, Bld 146 (*)    Calcium 8.8 (*)    All other  components within normal limits  CBC - Abnormal; Notable for the following:    WBC 11.6 (*)    All other components within normal limits  URINALYSIS, ROUTINE W REFLEX MICROSCOPIC (NOT AT [facility]) - Abnormal; Notable for the following:    Color, Urine AMBER (*)    APPearance CLOUDY (*)    Leukocytes, UA SMALL (*)    All other components within normal limits  URINE MICROSCOPIC-ADD ON - Abnormal; Notable for the following:    Squamous Epithelial / LPF FEW (*)    Bacteria, UA MANY (*)    All other components within normal limits  TROPONIN I    Imaging Review No results found.   EKG Interpretation   Date/Time:  Friday January 28 2015 01:24:51 EDT Ventricular Rate:  51 PR Interval:  124 QRS Duration: 94 QT Interval:  470 QTC Calculation: 433 R Axis:   78 Text Interpretation:  Sinus bradycardia Confirmed by [facility]  MD,  Danaja Lasota ([id number]) on 01/28/2015 1:57:43 AM      MDM   Final diagnoses:  None  ACS excluded with 3 days of ongoing symptoms with negative EKG and troponin.  HEART score is 1 for smoking as only risk.  Pain was only when retching to vomiting.    Medications  cefTRIAXone (ROCEPHIN) 1 g in dextrose 5 % 50 mL IVPB (not administered)  ondansetron (ZOFRAN) injection 4 mg (4 mg Intravenous Given 01/28/15 0156)  promethazine (PHENERGAN) injection 12.5 mg (12.5 mg Intravenous Given 01/28/15 0206)  dicyclomine (BENTYL) injection 20 mg (20 mg Intramuscular Given 01/28/15 0348)  ketorolac (TORADOL) 30 MG/ML injection 30 mg (30 mg Intravenous Given 01/28/15 0351)  sodium chloride 0.9 % bolus 500 mL (0 mLs Intravenous Stopped 01/28/15 0402)   Patient was clinically hydrated,  No hypotension or tachycardia.  Sleeping in the room upon entrance.  Hydrates and nausea treated.  PO challenged successfully.  Will treat for UTI and prescribe phenrgan suppositories.  Strict return precautions given.  Follow up with your PMD.      Adelaida Reindel, MD 01/28/15 [phone]

## 2015-01-29 ENCOUNTER — Encounter (HOSPITAL_BASED_OUTPATIENT_CLINIC_OR_DEPARTMENT_OTHER): Payer: Self-pay | Admitting: *Deleted

## 2015-01-29 ENCOUNTER — Inpatient Hospital Stay (HOSPITAL_BASED_OUTPATIENT_CLINIC_OR_DEPARTMENT_OTHER)
Admission: EM | Admit: 2015-01-29 | Discharge: 2015-01-30 | DRG: 392 | Disposition: A | Discharge status: Home / Self Care | Payer: Medicaid Other | Attending: General Surgery | Admitting: General Surgery

## 2015-01-29 ENCOUNTER — Emergency Department (HOSPITAL_BASED_OUTPATIENT_CLINIC_OR_DEPARTMENT_OTHER): Payer: Medicaid Other

## 2015-01-29 DIAGNOSIS — F17210 Nicotine dependence, cigarettes, uncomplicated: Secondary | ICD-10-CM | POA: Diagnosis present

## 2015-01-29 DIAGNOSIS — Z881 Allergy status to other antibiotic agents status: Secondary | ICD-10-CM

## 2015-01-29 DIAGNOSIS — K819 Cholecystitis, unspecified: Secondary | ICD-10-CM

## 2015-01-29 DIAGNOSIS — E876 Hypokalemia: Secondary | ICD-10-CM | POA: Diagnosis not present

## 2015-01-29 DIAGNOSIS — Z79899 Other long term (current) drug therapy: Secondary | ICD-10-CM

## 2015-01-29 DIAGNOSIS — R197 Diarrhea, unspecified: Secondary | ICD-10-CM | POA: Diagnosis present

## 2015-01-29 DIAGNOSIS — J45909 Unspecified asthma, uncomplicated: Secondary | ICD-10-CM | POA: Diagnosis present

## 2015-01-29 DIAGNOSIS — M1990 Unspecified osteoarthritis, unspecified site: Secondary | ICD-10-CM | POA: Diagnosis present

## 2015-01-29 DIAGNOSIS — R52 Pain, unspecified: Secondary | ICD-10-CM

## 2015-01-29 DIAGNOSIS — R1084 Generalized abdominal pain: Secondary | ICD-10-CM

## 2015-01-29 DIAGNOSIS — R1010 Upper abdominal pain, unspecified: Secondary | ICD-10-CM | POA: Diagnosis present

## 2015-01-29 DIAGNOSIS — R112 Nausea with vomiting, unspecified: Principal | ICD-10-CM | POA: Diagnosis present

## 2015-01-29 DIAGNOSIS — R109 Unspecified abdominal pain: Secondary | ICD-10-CM | POA: Diagnosis present

## 2015-01-29 DIAGNOSIS — R509 Fever, unspecified: Secondary | ICD-10-CM | POA: Diagnosis present

## 2015-01-29 HISTORY — DX: Unspecified osteoarthritis, unspecified site: M19.90

## 2015-01-29 LAB — URINALYSIS, ROUTINE W REFLEX MICROSCOPIC
BILIRUBIN URINE: NEGATIVE
Glucose, UA: NEGATIVE mg/dL
Hgb urine dipstick: NEGATIVE
Ketones, ur: NEGATIVE mg/dL
LEUKOCYTES UA: NEGATIVE
NITRITE: NEGATIVE
PROTEIN: NEGATIVE mg/dL
Specific Gravity, Urine: 1.003 — ABNORMAL LOW (ref 1.005–1.030)
Urobilinogen, UA: 0.2 mg/dL (ref 0.0–1.0)
pH: 7.5 (ref 5.0–8.0)

## 2015-01-29 LAB — COMPREHENSIVE METABOLIC PANEL
ALT: 22 U/L (ref 14–54)
AST: 25 U/L (ref 15–41)
Albumin: 3.8 g/dL (ref 3.5–5.0)
Alkaline Phosphatase: 42 U/L (ref 38–126)
Anion gap: 8 (ref 5–15)
BILIRUBIN TOTAL: 0.5 mg/dL (ref 0.3–1.2)
BUN: 8 mg/dL (ref 6–20)
CHLORIDE: 110 mmol/L (ref 101–111)
CO2: 22 mmol/L (ref 22–32)
CREATININE: 0.67 mg/dL (ref 0.44–1.00)
Calcium: 8.5 mg/dL — ABNORMAL LOW (ref 8.9–10.3)
GFR calc non Af Amer: >60 mL/min (ref >60)
GLUCOSE: 93 mg/dL (ref 65–99)
POTASSIUM: 3.4 mmol/L — AB (ref 3.5–5.1)
Sodium: 140 mmol/L (ref 135–145)
Total Protein: 6.5 g/dL (ref 6.5–8.1)

## 2015-01-29 LAB — CBC WITH DIFFERENTIAL/PLATELET
BASOS ABS: 0 10*3/uL (ref 0.0–0.1)
Basophils Relative: 0 % (ref 0–1)
Eosinophils Absolute: 0 10*3/uL (ref 0.0–0.7)
Eosinophils Relative: 0 % (ref 0–5)
HCT: 39.5 % (ref 36.0–46.0)
Hemoglobin: 13.2 g/dL (ref 12.0–15.0)
LYMPHS ABS: 2.9 10*3/uL (ref 0.7–4.0)
LYMPHS PCT: 32 % (ref 12–46)
MCH: 32.7 pg (ref 26.0–34.0)
MCHC: 33.4 g/dL (ref 30.0–36.0)
MCV: 97.8 fL (ref 78.0–100.0)
Monocytes Absolute: 0.8 10*3/uL (ref 0.1–1.0)
Monocytes Relative: 9 % (ref 3–12)
NEUTROS ABS: 5.3 10*3/uL (ref 1.7–7.7)
NEUTROS PCT: 59 % (ref 43–77)
PLATELETS: 225 10*3/uL (ref 150–400)
RBC: 4.04 MIL/uL (ref 3.87–5.11)
RDW: 12.5 % (ref 11.5–15.5)
WBC: 9 10*3/uL (ref 4.0–10.5)

## 2015-01-29 LAB — LIPASE, BLOOD: Lipase: 28 U/L (ref 22–51)

## 2015-01-29 LAB — PREGNANCY, URINE: Preg Test, Ur: NEGATIVE

## 2015-01-29 MED ORDER — ENOXAPARIN SODIUM 40 MG/0.4ML ~~LOC~~ SOLN
40.0000 mg | SUBCUTANEOUS | Status: DC
  Filled 2015-01-29 (×2): qty 0.4

## 2015-01-29 MED ORDER — KCL IN DEXTROSE-NACL 20-5-0.9 MEQ/L-%-% IV SOLN
INTRAVENOUS | Status: DC
  Administered 2015-01-29 – 2015-01-30 (×2): via INTRAVENOUS
  Filled 2015-01-29 (×5): qty 1000

## 2015-01-29 MED ORDER — ONDANSETRON HCL 4 MG/2ML IJ SOLN
4.0000 mg | Freq: Once | INTRAMUSCULAR | Status: AC
  Administered 2015-01-29: 4 mg via INTRAVENOUS
  Filled 2015-01-29: qty 2

## 2015-01-29 MED ORDER — ALBUTEROL SULFATE (2.5 MG/3ML) 0.083% IN NEBU: 2.5000 mg | INHALATION_SOLUTION | Freq: Four times a day (QID) | RESPIRATORY_TRACT | Status: DC | PRN

## 2015-01-29 MED ORDER — MORPHINE SULFATE 4 MG/ML IJ SOLN
4.0000 mg | Freq: Once | INTRAMUSCULAR | Status: AC
  Administered 2015-01-29: 4 mg via INTRAVENOUS
  Filled 2015-01-29: qty 1

## 2015-01-29 MED ORDER — SODIUM CHLORIDE 0.9 % IV SOLN
INTRAVENOUS | Status: AC
  Administered 2015-01-29: 16:00:00 via INTRAVENOUS

## 2015-01-29 MED ORDER — POTASSIUM CHLORIDE 10 MEQ/100ML IV SOLN
10.0000 meq | Freq: Once | INTRAVENOUS | Status: AC
  Administered 2015-01-29: 10 meq via INTRAVENOUS
  Filled 2015-01-29: qty 100

## 2015-01-29 MED ORDER — IOHEXOL 300 MG/ML  SOLN
100.0000 mL | Freq: Once | INTRAMUSCULAR | Status: AC | PRN
  Administered 2015-01-29: 100 mL via INTRAVENOUS

## 2015-01-29 MED ORDER — SODIUM CHLORIDE 0.9 % IV BOLUS (SEPSIS)
2000.0000 mL | Freq: Once | INTRAVENOUS | Status: AC
  Administered 2015-01-29: 2000 mL via INTRAVENOUS

## 2015-01-29 MED ORDER — IOHEXOL 300 MG/ML  SOLN
25.0000 mL | Freq: Once | INTRAMUSCULAR | Status: AC | PRN
  Administered 2015-01-29: 25 mL via ORAL

## 2015-01-29 MED ORDER — ONDANSETRON HCL 4 MG/2ML IJ SOLN
4.0000 mg | INTRAMUSCULAR | Status: DC | PRN
  Administered 2015-01-29: 4 mg via INTRAVENOUS
  Filled 2015-01-29: qty 2

## 2015-01-29 MED ORDER — METOCLOPRAMIDE HCL 5 MG/ML IJ SOLN
10.0000 mg | Freq: Once | INTRAMUSCULAR | Status: AC
  Administered 2015-01-29: 10 mg via INTRAVENOUS
  Filled 2015-01-29: qty 2

## 2015-01-29 MED ORDER — MORPHINE SULFATE 2 MG/ML IJ SOLN
2.0000 mg | INTRAMUSCULAR | Status: DC | PRN
  Administered 2015-01-29: 2 mg via INTRAVENOUS
  Filled 2015-01-29: qty 1

## 2015-01-29 MED ORDER — PROMETHAZINE HCL 25 MG/ML IJ SOLN
12.5000 mg | INTRAMUSCULAR | Status: DC | PRN
  Administered 2015-01-29 – 2015-01-30 (×2): 12.5 mg via INTRAVENOUS
  Filled 2015-01-29 (×3): qty 1

## 2015-01-29 MED ORDER — PANTOPRAZOLE SODIUM 40 MG IV SOLR
40.0000 mg | Freq: Every day | INTRAVENOUS | Status: DC
  Administered 2015-01-29: 40 mg via INTRAVENOUS
  Filled 2015-01-29 (×2): qty 40

## 2015-01-29 NOTE — ED Notes (Signed)
Pt seen here yesterday for same abd pain vomiting and fever , pt states pain, fever and vomiting cont, seen at [facility] this am and sent here

## 2015-01-29 NOTE — H&P (Signed)
Jenna Adams is an 44 y.o. female.   Chief Complaint: Diarrhea, n/v, abdominal pain HPI:  This is a 44 year old female in her usual state in good health until very early Thursday morning when she developed diarrhea followed by n/v.  After the n/v she then developed lower abdominal pain.  She also had fever and chills.  She went to [place] where it was felt she had a UTI and gastroenteritis.  She was discharged from there on an oral antibiotic. Since that time, she is not been able to keep anything down. She has developed some upper abdominal pain. The diarrhea has resolved. Still has fever. She went back there today. Where as her white blood cell count was elevated with her first visit, it is normal this visit. Liver function tests normal. Lipase normal. CT scan suggested possible mild thickening of the gallbladder but no stones. Ultrasound demonstrated gallbladder wall thickening but no stones. There is a concern for acute acalculous cholecystitis and so she was transferred to [facility] for further evaluation and treatment. Currently she feels better than she has in 3 days.  Past Medical History  Diagnosis Date  . Asthma   . Arthritis     Past Surgical History  Procedure Laterality Date  . Total hip arthroplasty    . Tubal ligation    . Joint replacement     Prior to Admission medications   Medication Sig Start Date End Date Taking? Authorizing Provider  albuterol (PROVENTIL) (2.5 MG/3ML) 0.083% nebulizer solution Take 2.5 mg by nebulization every 6 (six) hours as needed for wheezing or shortness of breath.    Yes Historical Provider, MD  Multiple Vitamin (MULTIVITAMIN WITH MINERALS) TABS tablet Take 1 tablet by mouth daily.   Yes Historical Provider, MD  nitrofurantoin, macrocrystal-monohydrate, (MACROBID) 100 MG capsule Take 1 capsule (100 mg total) by mouth 2 (two) times daily. X 7 days 01/28/15  Yes April Palumbo, MD  promethazine (PHENERGAN) 12.5 MG suppository Place 1 suppository  (12.5 mg total) rectally every 8 (eight) hours as needed for nausea or vomiting. 01/28/15  Yes April Palumbo, MD     History reviewed. No pertinent family history. Social History:  reports that she has been smoking.  She does not have any smokeless tobacco history on file. She reports that she does not drink alcohol or use illicit drugs.  Allergies:  Allergies  Allergen Reactions  . Biaxin [Clarithromycin] Nausea And Vomiting    Medications Prior to Admission  Medication Sig Dispense Refill  . albuterol (PROVENTIL) (2.5 MG/3ML) 0.083% nebulizer solution Take 2.5 mg by nebulization every 6 (six) hours as needed for wheezing or shortness of breath.     . Multiple Vitamin (MULTIVITAMIN WITH MINERALS) TABS tablet Take 1 tablet by mouth daily.    . nitrofurantoin, macrocrystal-monohydrate, (MACROBID) 100 MG capsule Take 1 capsule (100 mg total) by mouth 2 (two) times daily. X 7 days 14 capsule 0  . promethazine (PHENERGAN) 12.5 MG suppository Place 1 suppository (12.5 mg total) rectally every 8 (eight) hours as needed for nausea or vomiting. 12 suppository 0    Results for orders placed or performed during the hospital encounter of 01/29/15 (from the past 48 hour(s))  Urinalysis, Routine w reflex microscopic (not at [facility])     Status: Abnormal   Collection Time: 01/29/15 10:50 AM  Result Value Ref Range   Color, Urine YELLOW YELLOW   APPearance CLEAR CLEAR   Specific Gravity, Urine 1.003 (L) 1.005 - 1.030  pH 7.5 5.0 - 8.0   Glucose, UA NEGATIVE NEGATIVE mg/dL   Hgb urine dipstick NEGATIVE NEGATIVE   Bilirubin Urine NEGATIVE NEGATIVE   Ketones, ur NEGATIVE NEGATIVE mg/dL   Protein, ur NEGATIVE NEGATIVE mg/dL   Urobilinogen, UA 0.2 0.0 - 1.0 mg/dL   Nitrite NEGATIVE NEGATIVE   Leukocytes, UA NEGATIVE NEGATIVE    Comment: MICROSCOPIC NOT DONE ON URINES WITH NEGATIVE PROTEIN, BLOOD, LEUKOCYTES, NITRITE, OR GLUCOSE <1000 mg/dL.  Pregnancy, urine     Status: None   Collection Time:  01/29/15 10:50 AM  Result Value Ref Range   Preg Test, Ur NEGATIVE NEGATIVE    Comment:        THE SENSITIVITY OF THIS METHODOLOGY IS >20 mIU/mL.   Comprehensive metabolic panel     Status: Abnormal   Collection Time: 01/29/15 10:50 AM  Result Value Ref Range   Sodium 140 135 - 145 mmol/L   Potassium 3.4 (L) 3.5 - 5.1 mmol/L   Chloride 110 101 - 111 mmol/L   CO2 22 22 - 32 mmol/L   Glucose, Bld 93 65 - 99 mg/dL   BUN 8 6 - 20 mg/dL   Creatinine, Ser 0.67 0.44 - 1.00 mg/dL   Calcium 8.5 (L) 8.9 - 10.3 mg/dL   Total Protein 6.5 6.5 - 8.1 g/dL   Albumin 3.8 3.5 - 5.0 g/dL   AST 25 15 - 41 U/L   ALT 22 14 - 54 U/L   Alkaline Phosphatase 42 38 - 126 U/L   Total Bilirubin 0.5 0.3 - 1.2 mg/dL   GFR calc non Af Amer >60 >60 mL/min   GFR calc Af Amer >60 >60 mL/min    Comment: (NOTE) The eGFR has been calculated using the CKD EPI equation. This calculation has not been validated in all clinical situations. eGFR's persistently <60 mL/min signify possible Chronic Kidney Disease.    Anion gap 8 5 - 15  CBC with Differential/Platelet     Status: None   Collection Time: 01/29/15 10:50 AM  Result Value Ref Range   WBC 9.0 4.0 - 10.5 K/uL   RBC 4.04 3.87 - 5.11 MIL/uL   Hemoglobin 13.2 12.0 - 15.0 g/dL   HCT 39.5 36.0 - 46.0 %   MCV 97.8 78.0 - 100.0 fL   MCH 32.7 26.0 - 34.0 pg   MCHC 33.4 30.0 - 36.0 g/dL   RDW 12.5 11.5 - 15.5 %   Platelets 225 150 - 400 K/uL   Neutrophils Relative % 59 43 - 77 %   Neutro Abs 5.3 1.7 - 7.7 K/uL   Lymphocytes Relative 32 12 - 46 %   Lymphs Abs 2.9 0.7 - 4.0 K/uL   Monocytes Relative 9 3 - 12 %   Monocytes Absolute 0.8 0.1 - 1.0 K/uL   Eosinophils Relative 0 0 - 5 %   Eosinophils Absolute 0.0 0.0 - 0.7 K/uL   Basophils Relative 0 0 - 1 %   Basophils Absolute 0.0 0.0 - 0.1 K/uL  Lipase, blood     Status: None   Collection Time: 01/29/15 10:50 AM  Result Value Ref Range   Lipase 28 22 - 51 U/L   US Abdomen Complete  01/29/2015   CLINICAL  DATA:  Periumbilical and right-sided abdominal pain. Nausea, vomiting, and diarrhea for 4 days. Intermittent fevers.  EXAM: ULTRASOUND ABDOMEN COMPLETE  COMPARISON:  CT abdomen and pelvis 01/29/2015  FINDINGS: Gallbladder: Thickened, edematous gallbladder wall measuring up to 6 mm in thickness with likely  trace pericholecystic fluid. No gallstones identified. Evaluation for a sonographic Percell Miller sign was limited as the patient had recently received pain medication. The patient did once in [place] with scanning over the gallbladder.  Common bile duct: Diameter: 3.5 mm  Liver: 1.9 cm round, well-circumscribed hyperechoic lesion centrally in the liver near the IVC.  IVC: No abnormality visualized.  Pancreas: Not well visualized due to bowel gas.  Spleen: Size and appearance within normal limits.  Right Kidney: Length: 11.9 cm. Echogenicity within normal limits. No mass or hydronephrosis visualized.  Left Kidney: Length: 11.4 cm. Echogenicity within normal limits. No mass or hydronephrosis visualized.  Abdominal aorta: No aneurysm visualized. Distal abdominal aorta/bifurcation obscured.  Other findings: None.  IMPRESSION: 1. Thickened, edematous gallbladder wall with likely trace pericholecystic fluid and questionable sonographic Murphy's sign. No gallstones are identified, however acute acalculous cholecystitis is a possibility in the appropriate clinical setting. Other potential causes of gallbladder wall thickening include chronic cholecystitis and liver disease such as hepatitis. If further imaging evaluation for acute cholecystitis is clinically warranted, a nuclear medicine HIDA scan could be considered. 2. 1.9 cm hyperechoic liver lesion. Assuming the patient has no history of malignancy elsewhere, this is most likely benign (such as a hemangioma), and a follow-up ultrasound is recommended in 6 months.   Electronically Signed   By: Logan Bores   On: 01/29/2015 13:36   Ct Abdomen Pelvis W Contrast  01/29/2015    CLINICAL DATA:  Abdominal pain, nausea, vomiting and fever for several days. Initial encounter.  EXAM: CT ABDOMEN AND PELVIS WITH CONTRAST  TECHNIQUE: Multidetector CT imaging of the abdomen and pelvis was performed using the standard protocol following bolus administration of intravenous contrast.  CONTRAST:  100 mL OMNIPAQUE IOHEXOL 300 MG/ML SOLN, 25 mL OMNIPAQUE IOHEXOL 300 MG/ML SOLN  COMPARISON:  None.  FINDINGS: The lung bases are clear.  No pleural or pericardial effusion.  The gallbladder wall appears mildly thickened. No stones are visible by CT scan. The liver is low attenuating consistent with fatty infiltration. No focal lesion or biliary ductal dilatation. The adrenal glands, spleen, pancreas and kidneys all appear normal.  The stomach, small and large bowel and appendix are unremarkable. Small amount of free pelvic fluid is consistent with physiologic change. Uterus, adnexa and urinary bladder appear normal. No lymphadenopathy is identified.  The patient is status post left total hip replacement. No lytic or sclerotic bony lesion is identified.  IMPRESSION: Gallbladder wall thickening is nonspecific but could be due to cholecystitis. Recommend correlation with right upper quadrant abdominal ultrasound.  Fatty infiltration of the liver.   Electronically Signed   By: Inge Rise M.D.   On: 01/29/2015 12:16    Review of Systems  Constitutional: Positive for fever and chills.  Respiratory: Negative for shortness of breath.   Cardiovascular: Negative for chest pain.  Gastrointestinal: Positive for nausea, vomiting, abdominal pain and diarrhea. Negative for blood in stool.  Genitourinary: Negative for dysuria and hematuria.  Musculoskeletal: Positive for back pain.    Blood pressure 115/85, pulse 64, temperature 98.7 F (37.1 C), temperature source Oral, resp. rate 18, height 5' 5"  (1.651 m), weight 68.04 kg (150 lb), last menstrual period 11/28/2014, SpO2 99 %. Physical Exam   Constitutional: She appears well-developed and well-nourished. No distress.  HENT:  Head: Normocephalic and atraumatic.  Eyes: No scleral icterus.  Cardiovascular: Normal rate and regular rhythm.   Respiratory: Effort normal and breath sounds normal.  GI: Soft. She exhibits no mass. There is  tenderness (RUQ and epigastric areas).  Musculoskeletal: She exhibits no edema.  Neurological: She is alert.  Skin: Skin is warm and dry.  Psychiatric: She has a normal mood and affect. Her behavior is normal.     Assessment/Plan Acute onset of diarrhea followed by nausea and vomiting and then abdominal pain. Her history is more consistent with an acute viral gastroenteritis. Imaging studies however suggest the possibility of acute acalculous cholecystitis. She currently is afebrile and is feeling well.  Plan: Admit to the hospital. IV fluid hydration. HIDA scan.  Ineta Sinning J 01/29/2015, 5:04 PM

## 2015-01-29 NOTE — ED Provider Notes (Signed)
CSN: [id number]     Arrival date & time 01/29/15  1006 History   First MD Initiated Contact with Patient 01/29/15 1023     Chief Complaint  Patient presents with  . Abdominal Pain     (Consider location/radiation/quality/duration/timing/severity/associated sxs/prior Treatment) HPI Complains of lower abdominal pain, periumbilical onset 3 days ago, gradually, sharp in quality Pain is constant. Nonradiating. Associated symptoms include multiple episodes of vomiting,, fever with maximum temperature 102. she had diarrhea initially with the pain however diarrhea has subsided. She was seen here 01/28/2015 prescribed Phenergan suppositories and Macrobid which she's taken without relief.she is also treated her abdominal pain with Aleve, without relief She denies urinary symptoms denies vaginal discharge. Last normal menstrual period was 2 months ago. Past Medical History  Diagnosis Date  . Asthma   avascular necrosis of hip Past Surgical History  Procedure Laterality Date  . Total hip arthroplasty    . Tubal ligation     History reviewed. No pertinent family history. History  Substance Use Topics  . Smoking status: Current Every Day Smoker -- 1.00 packs/day for 20 years  . Smokeless tobacco: Not on file  . Alcohol Use: No   OB History    No data available     Review of Systems  Constitutional: Positive for fever.  HENT: Negative.   Respiratory: Negative.   Cardiovascular: Negative.   Gastrointestinal: Positive for nausea, vomiting, abdominal pain and diarrhea.  Musculoskeletal: Negative.   Skin: Negative.   Neurological: Negative.   Psychiatric/Behavioral: Negative.   All other systems reviewed and are negative.     Allergies  Biaxin  Home Medications   Prior to Admission medications   Medication Sig Start Date End Date Taking? Authorizing Provider  albuterol (PROVENTIL) (2.5 MG/3ML) 0.083% nebulizer solution Take 2.5 mg by nebulization every 6 (six) hours as needed.     Historical Provider, MD  budesonide-formoterol (SYMBICORT) 160-4.5 MCG/ACT inhaler Inhale 2 puffs into the lungs 2 (two) times daily.    Historical Provider, MD  gabapentin (NEURONTIN) 300 MG capsule Take 300 mg by mouth 3 (three) times daily.    Historical Provider, MD  nitrofurantoin, macrocrystal-monohydrate, (MACROBID) 100 MG capsule Take 1 capsule (100 mg total) by mouth 2 (two) times daily. X 7 days 01/28/15   April Palumbo, MD  predniSONE (DELTASONE) 20 MG tablet Take 2 tablets (40 mg total) by mouth 2 (two) times daily with a meal. 05/17/14   Teressa Lower, NP  promethazine (PHENERGAN) 12.5 MG suppository Place 1 suppository (12.5 mg total) rectally every 8 (eight) hours as needed for nausea or vomiting. 01/28/15   April Palumbo, MD   BP 133/75 mmHg  Pulse 70  Temp(Src) 98.3 F (36.8 C) (Oral)  Resp 18  Ht  (1.651 m)  Wt 150 lb (68.04 kg)  BMI 24.96 kg/m2  SpO2 100%  LMP 11/28/2014 Physical Exam  Constitutional: She appears well-developed and well-nourished. She appears distressed.  Nontoxic alert mildly uncomfortable appearing  HENT:  Head: Normocephalic and atraumatic.  Mucous membranes dry  Eyes: Conjunctivae are normal. Pupils are equal, round, and reactive to light.  Neck: Neck supple. No tracheal deviation present. No thyromegaly present.  Cardiovascular: Normal rate and regular rhythm.   No murmur heard. Pulmonary/Chest: Effort normal and breath sounds normal.  Abdominal: Soft. Bowel sounds are normal. She exhibits no distension and no mass. There is tenderness. There is no guarding.  Periumbilical tenderness  Musculoskeletal: Normal range of motion. She exhibits no edema or tenderness.  Neurological:  She is alert. Coordination normal.  Skin: Skin is warm and dry. No rash noted.  Psychiatric: She has a normal mood and affect.  Nursing note and vitals reviewed.   ED Course  Procedures (including critical care time) Labs Review Labs Reviewed  URINALYSIS,  ROUTINE W REFLEX MICROSCOPIC (NOT AT [facility])  PREGNANCY, URINE  COMPREHENSIVE METABOLIC PANEL  CBC WITH DIFFERENTIAL/PLATELET  LIPASE, BLOOD    Imaging Review No results found.   EKG Interpretation None     12:45 PM pain and nausea improved after treatment with intravenous Reglan and morphine and intravenous fluids. On reexamination she is tender diffusely across her abdomen. Right upper quadrant ultrasound ordered a recommendation of radiologist.. 134 5 PMpain control. Requesting additional medicine for nausea. On reexamination she is tender at low abdomen and at right upper quadrant. Results for orders placed or performed during the hospital encounter of 01/29/15  Urinalysis, Routine w reflex microscopic (not at [facility])  Result Value Ref Range   Color, Urine YELLOW YELLOW   APPearance CLEAR CLEAR   Specific Gravity, Urine 1.003 (L) 1.005 - 1.030   pH 7.5 5.0 - 8.0   Glucose, UA NEGATIVE NEGATIVE mg/dL   Hgb urine dipstick NEGATIVE NEGATIVE   Bilirubin Urine NEGATIVE NEGATIVE   Ketones, ur NEGATIVE NEGATIVE mg/dL   Protein, ur NEGATIVE NEGATIVE mg/dL   Urobilinogen, UA 0.2 0.0 - 1.0 mg/dL   Nitrite NEGATIVE NEGATIVE   Leukocytes, UA NEGATIVE NEGATIVE  Pregnancy, urine  Result Value Ref Range   Preg Test, Ur NEGATIVE NEGATIVE  Comprehensive metabolic panel  Result Value Ref Range   Sodium 140 135 - 145 mmol/L   Potassium 3.4 (L) 3.5 - 5.1 mmol/L   Chloride 110 101 - 111 mmol/L   CO2 22 22 - 32 mmol/L   Glucose, Bld 93 65 - 99 mg/dL   BUN 8 6 - 20 mg/dL   Creatinine, Ser 4.09 0.44 - 1.00 mg/dL   Calcium 8.5 (L) 8.9 - 10.3 mg/dL   Total Protein 6.5 6.5 - 8.1 g/dL   Albumin 3.8 3.5 - 5.0 g/dL   AST 25 15 - 41 U/L   ALT 22 14 - 54 U/L   Alkaline Phosphatase 42 38 - 126 U/L   Total Bilirubin 0.5 0.3 - 1.2 mg/dL   GFR calc non Af Amer >60 >60 mL/min   GFR calc Af Amer >60 >60 mL/min   Anion gap 8 5 - 15  CBC with Differential/Platelet  Result Value Ref Range   WBC 9.0 4.0  - 10.5 K/uL   RBC 4.04 3.87 - 5.11 MIL/uL   Hemoglobin 13.2 12.0 - 15.0 g/dL   HCT 81.1 91.4 - 78.2 %   MCV 97.8 78.0 - 100.0 fL   MCH 32.7 26.0 - 34.0 pg   MCHC 33.4 30.0 - 36.0 g/dL   RDW 95.6 21.3 - 08.6 %   Platelets 225 150 - 400 K/uL   Neutrophils Relative % 59 43 - 77 %   Neutro Abs 5.3 1.7 - 7.7 K/uL   Lymphocytes Relative 32 12 - 46 %   Lymphs Abs 2.9 0.7 - 4.0 K/uL   Monocytes Relative 9 3 - 12 %   Monocytes Absolute 0.8 0.1 - 1.0 K/uL   Eosinophils Relative 0 0 - 5 %   Eosinophils Absolute 0.0 0.0 - 0.7 K/uL   Basophils Relative 0 0 - 1 %   Basophils Absolute 0.0 0.0 - 0.1 K/uL  Lipase, blood  Result Value Ref Range  Lipase 28 22 - 51 U/L   US Abdomen Complete  01/29/2015   CLINICAL DATA:  Periumbilical and right-sided abdominal pain. Nausea, vomiting, and diarrhea for 4 days. Intermittent fevers.  EXAM: ULTRASOUND ABDOMEN COMPLETE  COMPARISON:  CT abdomen and pelvis 01/29/2015  FINDINGS: Gallbladder: Thickened, edematous gallbladder wall measuring up to 6 mm in thickness with likely trace pericholecystic fluid. No gallstones identified. Evaluation for a sonographic Eulah Pont sign was limited as the patient had recently received pain medication. The patient did once in [place] with scanning over the gallbladder.  Common bile duct: Diameter: 3.5 mm  Liver: 1.9 cm round, well-circumscribed hyperechoic lesion centrally in the liver near the IVC.  IVC: No abnormality visualized.  Pancreas: Not well visualized due to bowel gas.  Spleen: Size and appearance within normal limits.  Right Kidney: Length: 11.9 cm. Echogenicity within normal limits. No mass or hydronephrosis visualized.  Left Kidney: Length: 11.4 cm. Echogenicity within normal limits. No mass or hydronephrosis visualized.  Abdominal aorta: No aneurysm visualized. Distal abdominal aorta/bifurcation obscured.  Other findings: None.  IMPRESSION: 1. Thickened, edematous gallbladder wall with likely trace pericholecystic fluid and  questionable sonographic Murphy's sign. No gallstones are identified, however acute acalculous cholecystitis is a possibility in the appropriate clinical setting. Other potential causes of gallbladder wall thickening include chronic cholecystitis and liver disease such as hepatitis. If further imaging evaluation for acute cholecystitis is clinically warranted, a nuclear medicine HIDA scan could be considered. 2. 1.9 cm hyperechoic liver lesion. Assuming the patient has no history of malignancy elsewhere, this is most likely benign (such as a hemangioma), and a follow-up ultrasound is recommended in 6 months.   Electronically Signed   By: Sebastian Ache   On: 01/29/2015 13:36   Ct Abdomen Pelvis W Contrast  01/29/2015   CLINICAL DATA:  Abdominal pain, nausea, vomiting and fever for several days. Initial encounter.  EXAM: CT ABDOMEN AND PELVIS WITH CONTRAST  TECHNIQUE: Multidetector CT imaging of the abdomen and pelvis was performed using the standard protocol following bolus administration of intravenous contrast.  CONTRAST:  100 mL OMNIPAQUE IOHEXOL 300 MG/ML SOLN, 25 mL OMNIPAQUE IOHEXOL 300 MG/ML SOLN  COMPARISON:  None.  FINDINGS: The lung bases are clear.  No pleural or pericardial effusion.  The gallbladder wall appears mildly thickened. No stones are visible by CT scan. The liver is low attenuating consistent with fatty infiltration. No focal lesion or biliary ductal dilatation. The adrenal glands, spleen, pancreas and kidneys all appear normal.  The stomach, small and large bowel and appendix are unremarkable. Small amount of free pelvic fluid is consistent with physiologic change. Uterus, adnexa and urinary bladder appear normal. No lymphadenopathy is identified.  The patient is status post left total hip replacement. No lytic or sclerotic bony lesion is identified.  IMPRESSION: Gallbladder wall thickening is nonspecific but could be due to cholecystitis. Recommend correlation with right upper quadrant  abdominal ultrasound.  Fatty infiltration of the liver.   Electronically Signed   By: Drusilla Kanner M.D.   On: 01/29/2015 12:16    MDM  I spoke with Dr. Abbey Chatters  [facility] floorfor further evaluation. Clinically patient with right upper quadrant tenderness though acute cholecystitis is not obviousclinically Final diagnoses:  Pain   She'll be followed clinically while in hospital as well as likely to receive HIDA scan Diagnosis #1 abdominal pain #2 vomiting #3hypokalemia     Doug Sou, MD 01/29/15 1429

## 2015-01-29 NOTE — ED Notes (Signed)
MD at bedside. 

## 2015-01-29 NOTE — ED Notes (Signed)
Report called to [facility]  nurse Maura RN.

## 2015-01-30 ENCOUNTER — Inpatient Hospital Stay (HOSPITAL_COMMUNITY): Payer: Medicaid Other

## 2015-01-30 LAB — BASIC METABOLIC PANEL
ANION GAP: 7 (ref 5–15)
BUN: 5 mg/dL — ABNORMAL LOW (ref 6–20)
CALCIUM: 8.5 mg/dL — AB (ref 8.9–10.3)
CHLORIDE: 108 mmol/L (ref 101–111)
CO2: 23 mmol/L (ref 22–32)
CREATININE: 0.66 mg/dL (ref 0.44–1.00)
Glucose, Bld: 121 mg/dL — ABNORMAL HIGH (ref 65–99)
POTASSIUM: 3.2 mmol/L — AB (ref 3.5–5.1)
Sodium: 138 mmol/L (ref 135–145)

## 2015-01-30 MED ORDER — POTASSIUM CHLORIDE 10 MEQ/100ML IV SOLN
10.0000 meq | INTRAVENOUS | Status: AC
Start: 2015-01-30 — End: 2015-01-30
  Administered 2015-01-30 (×2): 10 meq via INTRAVENOUS
  Filled 2015-01-30 (×3): qty 100

## 2015-01-30 MED ORDER — TECHNETIUM TC 99M MEBROFENIN IV KIT
4.9000 | PACK | Freq: Once | INTRAVENOUS | Status: AC | PRN
  Administered 2015-01-30: 4.9 via INTRAVENOUS

## 2015-01-30 MED ORDER — ONDANSETRON 4 MG PO TBDP: 4.0000 mg | ORAL_TABLET | Freq: Three times a day (TID) | ORAL | Status: AC | PRN

## 2015-01-30 NOTE — Discharge Instructions (Signed)

## 2015-01-30 NOTE — Progress Notes (Signed)
Transporter on unit to take pt to HIDA scan. Pt not in room. Mother of pt in room stated, "Pt out for a walk cause back was hurting. I'll call (pt) cell phone." Pt not visible in hall.

## 2015-01-30 NOTE — Progress Notes (Signed)
Dr. Andrey Campanile made aware pt tolerated clears. Discharge order entered into Epic per MD. MD also made aware pt did not complete runs of potassium IV. Instructed pt to drink Gatorade after dc per Dr. Andrey Campanile. IV's discontinued and pt prepared for dc.

## 2015-01-30 NOTE — Progress Notes (Signed)
Assessment unchanged. Pt verbalized understanding of dc instructions through teach back regarding follow up care and when to notify MD if any further problems occur. No scripts at time of dc. Discharged via wc to front entrance to meet awaiting vehicle to carry home. Accompanied by NT and family.

## 2015-01-30 NOTE — Progress Notes (Signed)
Subjective: Pt states she couldn't sleep well last night due to n/retching and chronic back/hip issues and IV beeping. Didn't vomit large amounts more like retching. Pt wants to know when she can go home. Have 4 kids, disable mother Still with some lower abd pain Objective: Vital signs in last 24 hours: Temp:  [98.1 F (36.7 C)-98.7 F (37.1 C)] 98.7 F (37.1 C) (07/24 0551) Pulse Rate:  [57-70] 67 (07/24 0551) Resp:  [18] 18 (07/24 0551) BP: (106-140)/(64-95) 140/82 mmHg (07/24 0551) SpO2:  [97 %-100 %] 100 % (07/24 0551) Weight:  [68.04 kg (150 lb)] 68.04 kg (150 lb) (07/23 1009) Last BM Date: 01/28/15  Intake/Output from previous day: 07/23 0701 - 07/24 0700 In: 1533.3 [P.O.:100; I.V.:1433.3] Out: 1350 [Urine:1350] Intake/Output this shift:    Alert, sitting in chair, nontoxic, a little anxious cta b/l Reg Soft, mild TTP in upper and lower abd, no guarding/rebound, nd  Lab Results:   Recent Labs  01/28/15 0125 01/29/15 1050  WBC 11.6* 9.0  HGB 13.2 13.2  HCT 39.6 39.5  PLT 309 225   BMET  Recent Labs  01/29/15 1050 01/30/15 0700  NA 140 138  K 3.4* 3.2*  CL 110 108  CO2 22 23  GLUCOSE 93 121*  BUN 8 5*  CREATININE 0.67 0.66  CALCIUM 8.5* 8.5*   PT/INR No results for input(s): LABPROT, INR in the last 72 hours. ABG No results for input(s): PHART, HCO3 in the last 72 hours.  Invalid input(s): PCO2, PO2  Studies/Results: US Abdomen Complete  01/29/2015   CLINICAL DATA:  Periumbilical and right-sided abdominal pain. Nausea, vomiting, and diarrhea for 4 days. Intermittent fevers.  EXAM: ULTRASOUND ABDOMEN COMPLETE  COMPARISON:  CT abdomen and pelvis 01/29/2015  FINDINGS: Gallbladder: Thickened, edematous gallbladder wall measuring up to 6 mm in thickness with likely trace pericholecystic fluid. No gallstones identified. Evaluation for a sonographic Eulah Pont sign was limited as the patient had recently received pain medication. The patient did once in [place]  with scanning over the gallbladder.  Common bile duct: Diameter: 3.5 mm  Liver: 1.9 cm round, well-circumscribed hyperechoic lesion centrally in the liver near the IVC.  IVC: No abnormality visualized.  Pancreas: Not well visualized due to bowel gas.  Spleen: Size and appearance within normal limits.  Right Kidney: Length: 11.9 cm. Echogenicity within normal limits. No mass or hydronephrosis visualized.  Left Kidney: Length: 11.4 cm. Echogenicity within normal limits. No mass or hydronephrosis visualized.  Abdominal aorta: No aneurysm visualized. Distal abdominal aorta/bifurcation obscured.  Other findings: None.  IMPRESSION: 1. Thickened, edematous gallbladder wall with likely trace pericholecystic fluid and questionable sonographic Murphy's sign. No gallstones are identified, however acute acalculous cholecystitis is a possibility in the appropriate clinical setting. Other potential causes of gallbladder wall thickening include chronic cholecystitis and liver disease such as hepatitis. If further imaging evaluation for acute cholecystitis is clinically warranted, a nuclear medicine HIDA scan could be considered. 2. 1.9 cm hyperechoic liver lesion. Assuming the patient has no history of malignancy elsewhere, this is most likely benign (such as a hemangioma), and a follow-up ultrasound is recommended in 6 months.   Electronically Signed   By: Sebastian Ache   On: 01/29/2015 13:36   Ct Abdomen Pelvis W Contrast  01/29/2015   CLINICAL DATA:  Abdominal pain, nausea, vomiting and fever for several days. Initial encounter.  EXAM: CT ABDOMEN AND PELVIS WITH CONTRAST  TECHNIQUE: Multidetector CT imaging of the abdomen and pelvis was performed using the standard protocol  following bolus administration of intravenous contrast.  CONTRAST:  100 mL OMNIPAQUE IOHEXOL 300 MG/ML SOLN, 25 mL OMNIPAQUE IOHEXOL 300 MG/ML SOLN  COMPARISON:  None.  FINDINGS: The lung bases are clear.  No pleural or pericardial effusion.  The  gallbladder wall appears mildly thickened. No stones are visible by CT scan. The liver is low attenuating consistent with fatty infiltration. No focal lesion or biliary ductal dilatation. The adrenal glands, spleen, pancreas and kidneys all appear normal.  The stomach, small and large bowel and appendix are unremarkable. Small amount of free pelvic fluid is consistent with physiologic change. Uterus, adnexa and urinary bladder appear normal. No lymphadenopathy is identified.  The patient is status post left total hip replacement. No lytic or sclerotic bony lesion is identified.  IMPRESSION: Gallbladder wall thickening is nonspecific but could be due to cholecystitis. Recommend correlation with right upper quadrant abdominal ultrasound.  Fatty infiltration of the liver.   Electronically Signed   By: Drusilla Kanner M.D.   On: 01/29/2015 12:16    Anti-infectives: Anti-infectives    None      Assessment/Plan: Nausea/vomiting Abdominal pain Hypokalemia  Pt appears somewhat frustrated. Explained rationale for HIDA. States that if HIDA negative I can go home? i recommended that if her HIDA is negative we give her a diet and see if she tolerates that. Pt appeared frustrated/upset with that. I have got to get home. Explained my concern if that she left before a PO trial and had ongoing n/v she would be at risk for dehydration/electrolyte imbalance.  Explained that our goal is to get pt's out of the hospital as quickly as possible but she had to be stable for dc.   Replace potassium Antiemetics as tolerated  Mary Sella. Andrey Campanile, MD, FACS General, Bariatric, & Minimally Invasive Surgery [facility] Surgery, [place]   LOS: 1 day    Atilano Ina 01/30/2015

## 2015-01-30 NOTE — Progress Notes (Signed)
Pt and significant other stepped off elevator. Pt stated, " I just needed to go to the gift shop." IV pump not with pt. 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 IV heplock flushed with good blood return and IVF's resumed. Pt was told earlier in shift that leaving unit was not permitted especially with running IV. Reassured pt for the safety of our pt's we ask that pt's remain on 5W for duration of stay. Reiterated policy and ask pt to call if needing IV heplocked for any reason. Family and pt further reassured. Pt prepared and transported to HIDA scan. Accompanied by Judith Blonder.

## 2015-01-30 NOTE — Discharge Summary (Signed)
Physician Discharge Summary  Jenna Adams QIH:[id number] DOB: September 05, 1970 DOA: 01/29/2015  PCP: [name], MD  Admit date: 01/29/2015 Discharge date: 01/30/2015  Recommendations for Outpatient Follow-up:   Follow-up Information    Follow up with [name], MD. Schedule an appointment as soon as possible for a visit in 2 weeks.   Specialty:  Family Medicine   Contact information:   [phone] [place] [address] [phone]      Discharge Diagnoses:  1. Nausea/vomiting 2. Abdominal pain  Surgical Procedure: none  Discharge Condition: fair Disposition: home   Diet recommendation: clears/fulls/bland diet  Filed Weights   01/29/15 1009  Weight: 68.04 kg (150 lb)    History of present illness:  This is a 44 year old female in her usual state in good health until very early Thursday morning when she developed diarrhea followed by n/v. After the n/v she then developed lower abdominal pain. She also had fever and chills. She went to [facility] where it was felt she had a UTI and gastroenteritis. She was discharged from there on an oral antibiotic. Since that time, she is not been able to keep anything down. She has developed some upper abdominal pain. The diarrhea has resolved. Still has fever. She went back there today. Where as her white blood cell count was elevated with her first visit, it is normal this visit. Liver function tests normal. Lipase normal. CT scan suggested possible mild thickening of the gallbladder but no stones. Ultrasound demonstrated gallbladder wall thickening but no stones. There is a concern for acute acalculous cholecystitis and so she was transferred to [facility] for further evaluation and treatment. Currently she feels better than she has in 3 days.  Hospital Course:  She was admitted for overnight observation with a plan to get a nuclear medicine scan of her gallbladder following morning. She remained afebrile. She  had some nausea and retching overnight however on rounds she was quite interested in desiring to go home.  She was no longer tender. Her nuclear medicine scan of her gallbladder demonstrated no evidence of  Cholecystitis.  She was given a clear liquid diet for which she tolerated. I spoke with the patient via telephone who stated that she was feeling better.  I discussed the results of the nuclear medicine scan with her.  I felt that she was stable for discharge.  We discussed her post discharge diet plans.  She was given a prescription for Zofran.  I instructed her on what to call for. She was given a note for work for the past 2 days.   Discharge Instructions  Discharge Instructions    Diet general    Complete by:  As directed   See above comments about diet restrictions     Discharge instructions    Complete by:  As directed   Follow a light diet for the next day or so. Drink plenty of liquids. Avoid rich, fatty, greasy foods for several days. If symptoms return, have high fever return to ED     Increase activity slowly    Complete by:  As directed             Medication List    TAKE these medications        albuterol (2.5 MG/3ML) 0.083% nebulizer solution  Commonly known as:  PROVENTIL  Take 2.5 mg by nebulization every 6 (six) hours as needed for wheezing or shortness of breath.     multivitamin  with minerals Tabs tablet  Take 1 tablet by mouth daily.     nitrofurantoin (macrocrystal-monohydrate) 100 MG capsule  Commonly known as:  MACROBID  Take 1 capsule (100 mg total) by mouth 2 (two) times daily. X 7 days     ondansetron 4 MG disintegrating tablet  Commonly known as:  ZOFRAN ODT  Take 1 tablet (4 mg total) by mouth every 8 (eight) hours as needed for nausea or vomiting.     promethazine 12.5 MG suppository  Commonly known as:  PHENERGAN  Place 1 suppository (12.5 mg total) rectally every 8 (eight) hours as needed for nausea or vomiting.           Follow-up  Information    Follow up with [name], MD. Schedule an appointment as soon as possible for a visit in 2 weeks.   Specialty:  Family Medicine   Contact information:   [phone] [address] [place] [address] [phone]        The results of significant diagnostics from this hospitalization (including imaging, microbiology, ancillary and laboratory) are listed below for reference.    Significant Diagnostic Studies: Nm Hepatobiliary Liver Func  01/30/2015   CLINICAL DATA:  Low abdominal pain, nausea and vomiting for 5 days. Gallbladder wall thickening and trace fluid on prior exam.  EXAM: NUCLEAR MEDICINE HEPATOBILIARY INCLUDE GB  TECHNIQUE: Standard hepato biliary nuclear medicine imaging technique was utilized.  RADIOPHARMACEUTICALS:  4.9 mCi Tc 9 9 M Choletec  COMPARISON:  Ultrasound 01/29/2015  FINDINGS: There is prompt symmetric hepatic uptake and gallbladder radiotracer uptake. Prompt excretion into the duodenum is identified. Gallbladder is visualized by 30 min imaging.  IMPRESSION: Gallbladder fills normally with radiotracer contrast, with excretion of radiotracer into the duodenum.   Electronically Signed   By: Christiana Pellant M.D.   On: 01/30/2015 12:50   US Abdomen Complete  01/29/2015   CLINICAL DATA:  Periumbilical and right-sided abdominal pain. Nausea, vomiting, and diarrhea for 4 days. Intermittent fevers.  EXAM: ULTRASOUND ABDOMEN COMPLETE  COMPARISON:  CT abdomen and pelvis 01/29/2015  FINDINGS: Gallbladder: Thickened, edematous gallbladder wall measuring up to 6 mm in thickness with likely trace pericholecystic fluid. No gallstones identified. Evaluation for a sonographic Eulah Pont sign was limited as the patient had recently received pain medication. The patient did once in [place] with scanning over the gallbladder.  Common bile duct: Diameter: 3.5 mm  Liver: 1.9 cm round, well-circumscribed hyperechoic lesion centrally in the liver near the IVC.  IVC: No abnormality  visualized.  Pancreas: Not well visualized due to bowel gas.  Spleen: Size and appearance within normal limits.  Right Kidney: Length: 11.9 cm. Echogenicity within normal limits. No mass or hydronephrosis visualized.  Left Kidney: Length: 11.4 cm. Echogenicity within normal limits. No mass or hydronephrosis visualized.  Abdominal aorta: No aneurysm visualized. Distal abdominal aorta/bifurcation obscured.  Other findings: None.  IMPRESSION: 1. Thickened, edematous gallbladder wall with likely trace pericholecystic fluid and questionable sonographic Murphy's sign. No gallstones are identified, however acute acalculous cholecystitis is a possibility in the appropriate clinical setting. Other potential causes of gallbladder wall thickening include chronic cholecystitis and liver disease such as hepatitis. If further imaging evaluation for acute cholecystitis is clinically warranted, a nuclear medicine HIDA scan could be considered. 2. 1.9 cm hyperechoic liver lesion. Assuming the patient has no history of malignancy elsewhere, this is most likely benign (such as a hemangioma), and a follow-up ultrasound is recommended in 6 months.   Electronically Signed  By: Sebastian Ache   On: 01/29/2015 13:36   Ct Abdomen Pelvis W Contrast  01/29/2015   CLINICAL DATA:  Abdominal pain, nausea, vomiting and fever for several days. Initial encounter.  EXAM: CT ABDOMEN AND PELVIS WITH CONTRAST  TECHNIQUE: Multidetector CT imaging of the abdomen and pelvis was performed using the standard protocol following bolus administration of intravenous contrast.  CONTRAST:  100 mL OMNIPAQUE IOHEXOL 300 MG/ML SOLN, 25 mL OMNIPAQUE IOHEXOL 300 MG/ML SOLN  COMPARISON:  None.  FINDINGS: The lung bases are clear.  No pleural or pericardial effusion.  The gallbladder wall appears mildly thickened. No stones are visible by CT scan. The liver is low attenuating consistent with fatty infiltration. No focal lesion or biliary ductal dilatation. The adrenal  glands, spleen, pancreas and kidneys all appear normal.  The stomach, small and large bowel and appendix are unremarkable. Small amount of free pelvic fluid is consistent with physiologic change. Uterus, adnexa and urinary bladder appear normal. No lymphadenopathy is identified.  The patient is status post left total hip replacement. No lytic or sclerotic bony lesion is identified.  IMPRESSION: Gallbladder wall thickening is nonspecific but could be due to cholecystitis. Recommend correlation with right upper quadrant abdominal ultrasound.  Fatty infiltration of the liver.   Electronically Signed   By: Drusilla Kanner M.D.   On: 01/29/2015 12:16    Microbiology: No results found for this or any previous visit (from the past 240 hour(s)).   Labs: Basic Metabolic Panel:  Recent Labs Lab 01/28/15 0125 01/29/15 1050 01/30/15 0700  NA 140 140 138  K 3.1* 3.4* 3.2*  CL 108 110 108  CO2 26 22 23   GLUCOSE 146* 93 121*  BUN 18 8 5*  CREATININE 0.90 0.67 0.66  CALCIUM 8.8* 8.5* 8.5*   Liver Function Tests:  Recent Labs Lab 01/28/15 0125 01/29/15 1050  AST 21 25  ALT 19 22  ALKPHOS 50 42  BILITOT 0.7 0.5  PROT 7.0 6.5  ALBUMIN 4.5 3.8    Recent Labs Lab 01/28/15 0125 01/29/15 1050  LIPASE 18* 28   No results for input(s): AMMONIA in the last 168 hours. CBC:  Recent Labs Lab 01/28/15 0125 01/29/15 1050  WBC 11.6* 9.0  NEUTROABS  --  5.3  HGB 13.2 13.2  HCT 39.6 39.5  MCV 97.8 97.8  PLT 309 225   Cardiac Enzymes:  Recent Labs Lab 01/28/15 0125  TROPONINI <0.03   BNP: BNP (last 3 results) No results for input(s): BNP in the last 8760 hours.  ProBNP (last 3 results) No results for input(s): PROBNP in the last 8760 hours.  CBG: No results for input(s): GLUCAP in the last 168 hours.  Principal Problem:   Abdominal pain Active Problems:   Nausea and vomiting   Asthma, chronic   Time coordinating discharge: 15 minutes  Signed:  Atilano Ina, MD  [facility], [place] [phone] 01/30/2015, 1:57 PM

## 2016-09-05 IMAGING — CR DG CHEST 2V
2 series · 2 of 2 positions shown · non-contrast
Comparison: 11/26/2011

CLINICAL DATA: Cough.  Wheezing.  Shortness of breath.  Asthma.

EXAM:
CHEST  2 VIEW

[w chest pa]
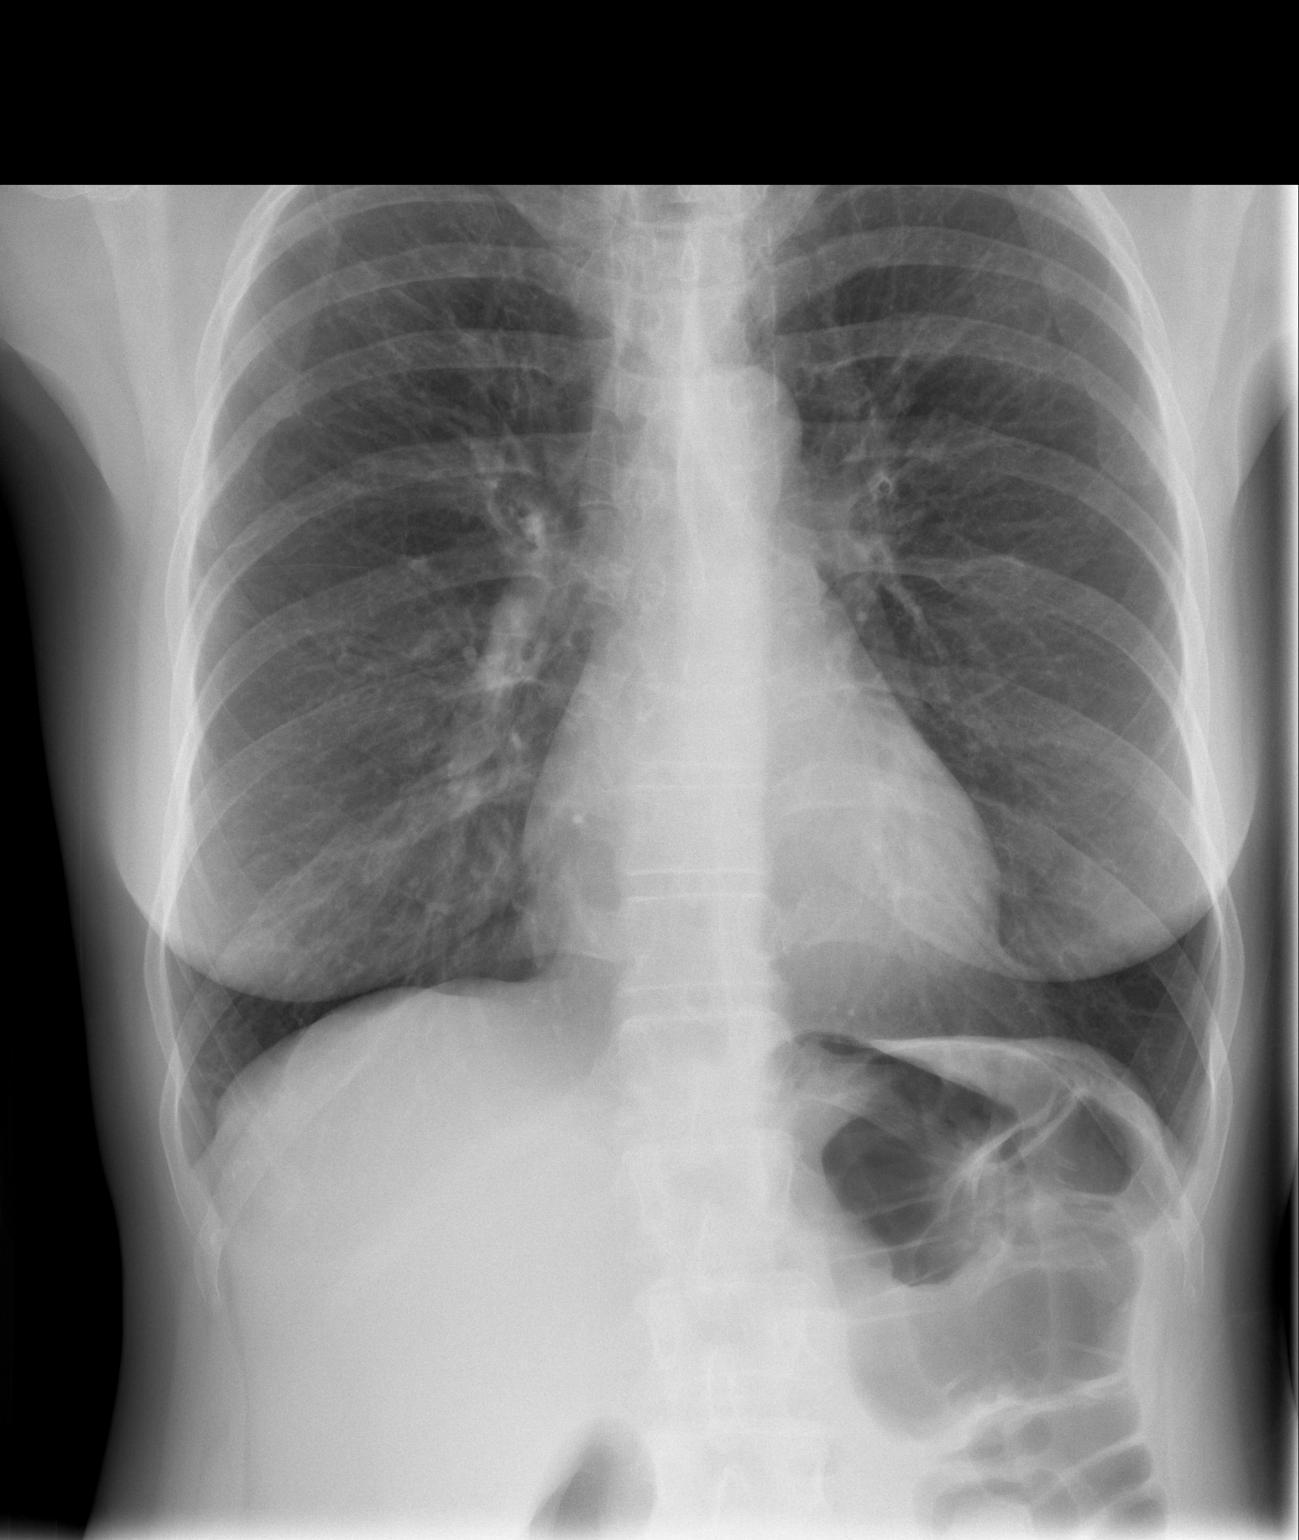

[w chest lat]
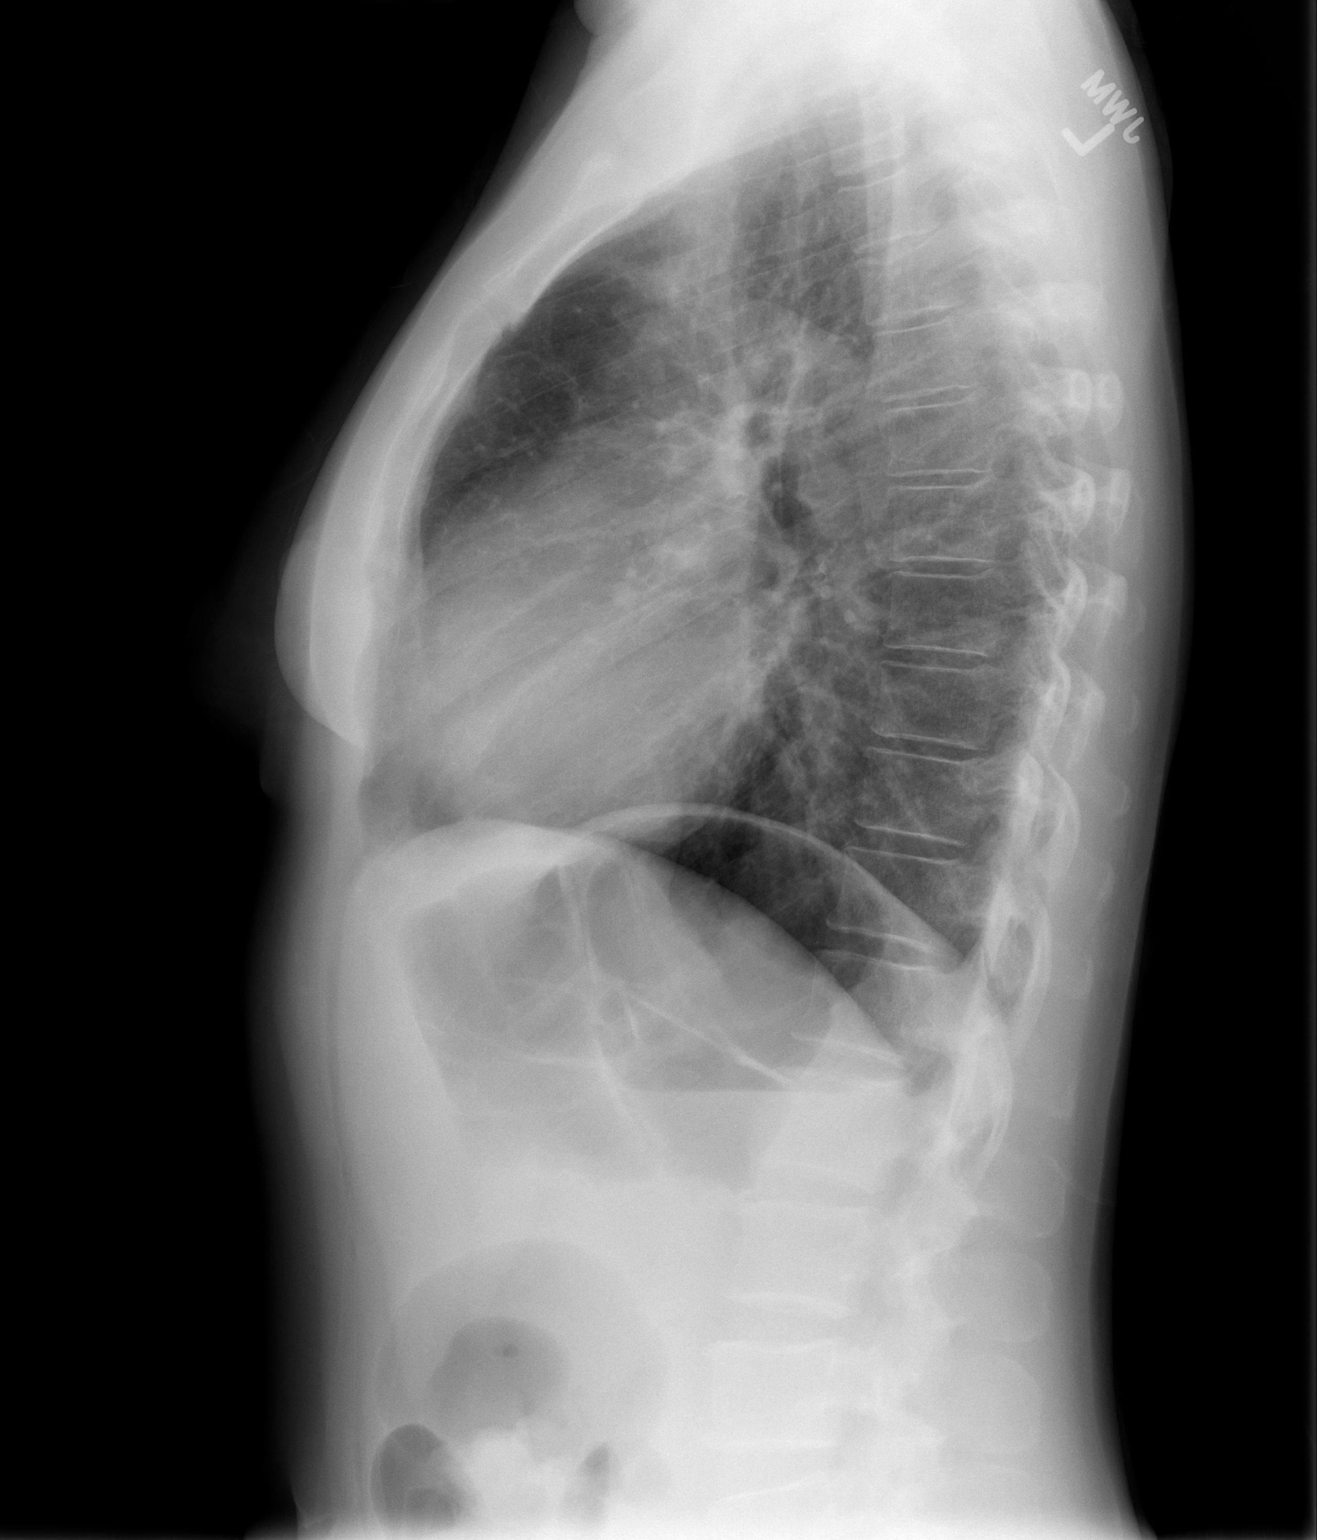

[2 of 2 positions shown; findings below may reference images not displayed]

FINDINGS: Airway thickening is present, suggesting bronchitis or reactive
airways disease. No airspace opacity identified. Cardiac and
mediastinal margins appear normal. No pleural effusion.
IMPRESSION: 1. Airway thickening is present, suggesting bronchitis or reactive
airways disease.

## 2017-07-06 IMAGING — CT CT ABD-PELV W/ CM
2 of 6 series · 17 of 46 positions shown, 19 images · IV contrast (APPLIED)
Comparison: None.

CLINICAL DATA: Abdominal pain, nausea, vomiting and fever for
several days. Initial encounter.

EXAM:
CT ABDOMEN AND PELVIS WITH CONTRAST
TECHNIQUE: Multidetector CT imaging of the abdomen and pelvis was performed
using the standard protocol following bolus administration of
intravenous contrast.
CONTRAST:  100 mL OMNIPAQUE IOHEXOL 300 MG/ML SOLN, 25 mL OMNIPAQUE
IOHEXOL 300 MG/ML SOLN

[Series 2: abd/pelvis 5.0 b31f · axial · 0.77mm/px · z∈[-394,-8]mm · 14 of 89 slices shown, 16 images]
[im 6/89  soft-tissue]
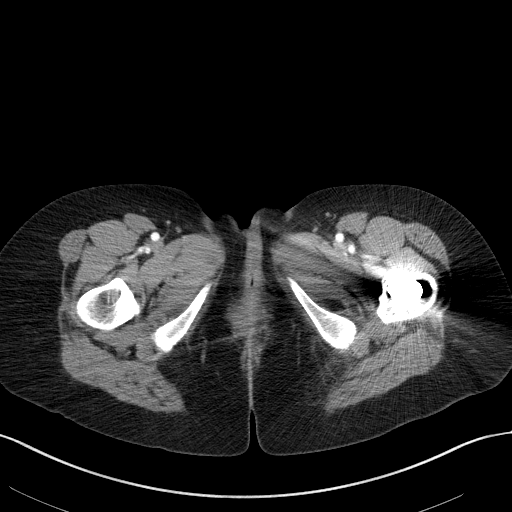
[im 6/89  bone]
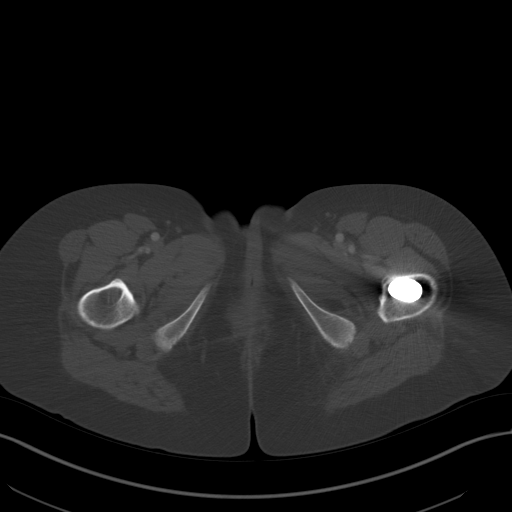
[im 11/89  soft-tissue]
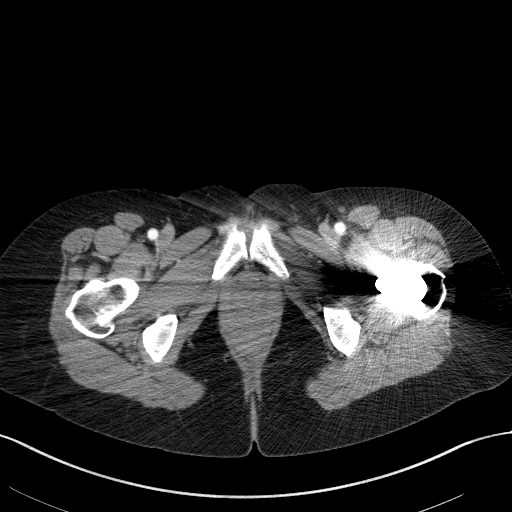
[im 16/89  soft-tissue]
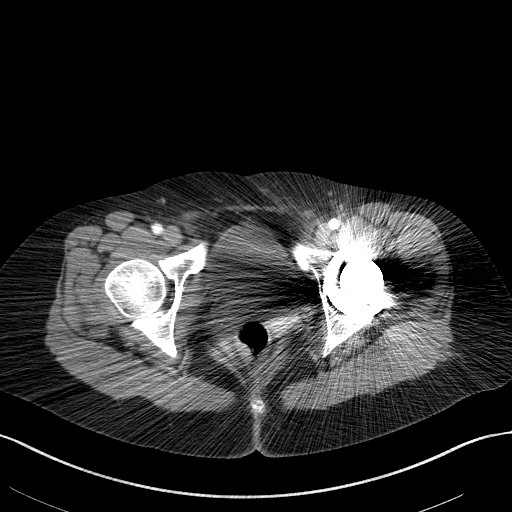
[im 26/89  soft-tissue]
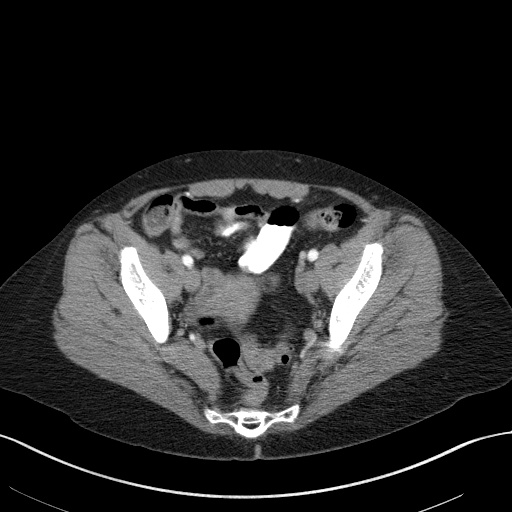
[im 32/89  soft-tissue]
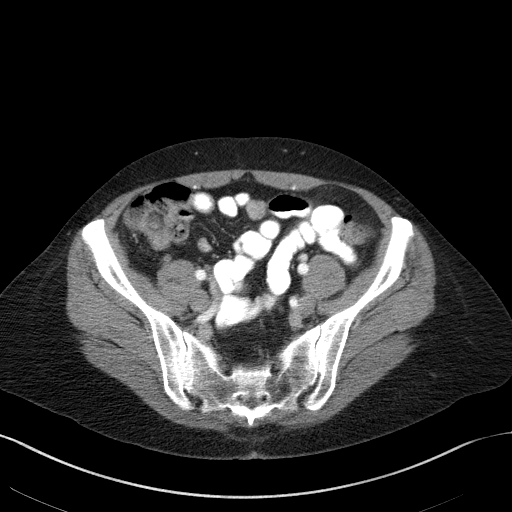
[im 37/89  soft-tissue]
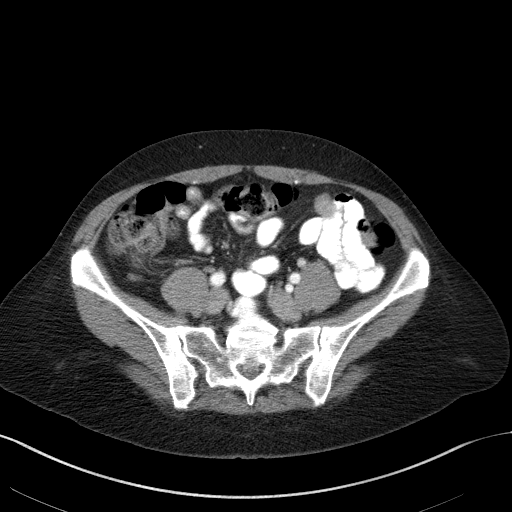
[im 42/89  soft-tissue]
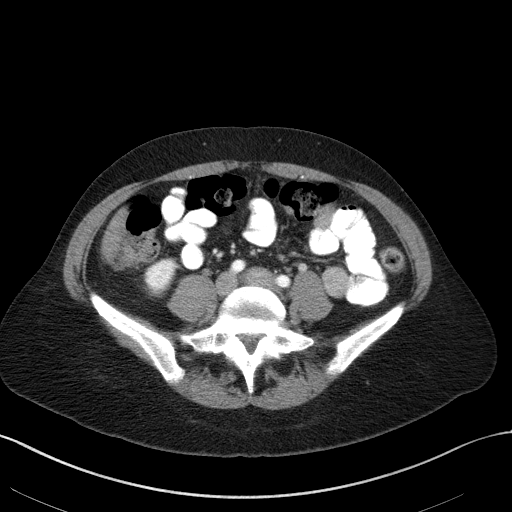
[im 47/89  soft-tissue]
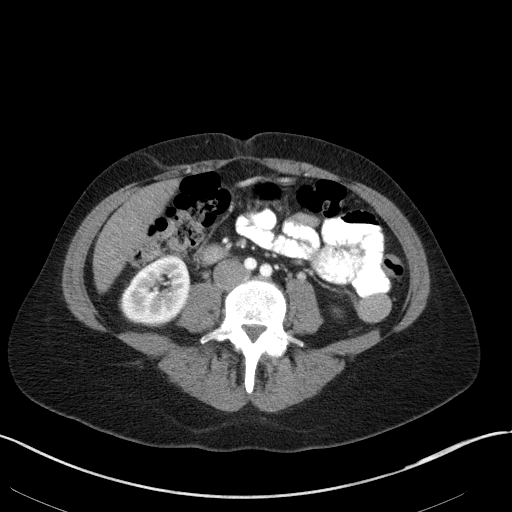
[im 52/89  soft-tissue]
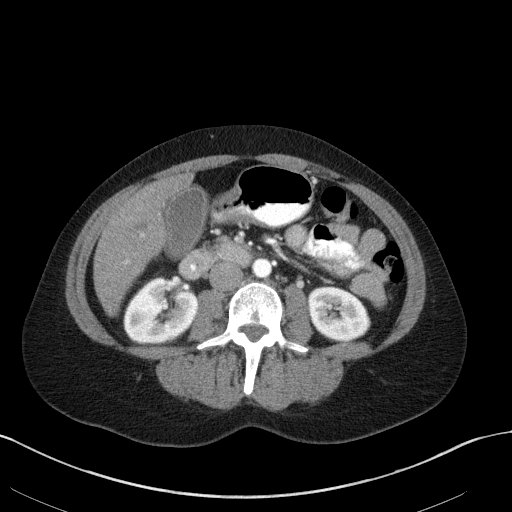
[im 52/89  bone]
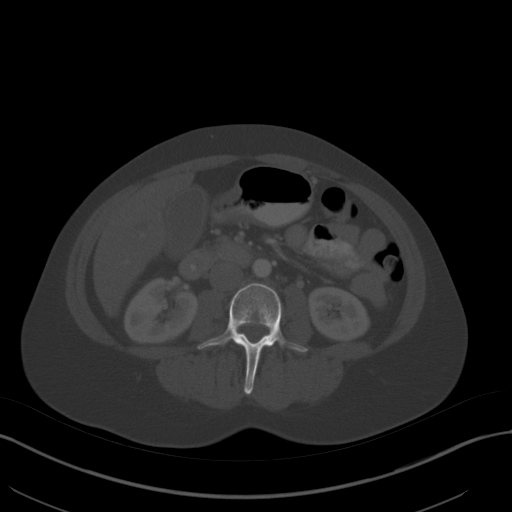
[im 57/89  soft-tissue]
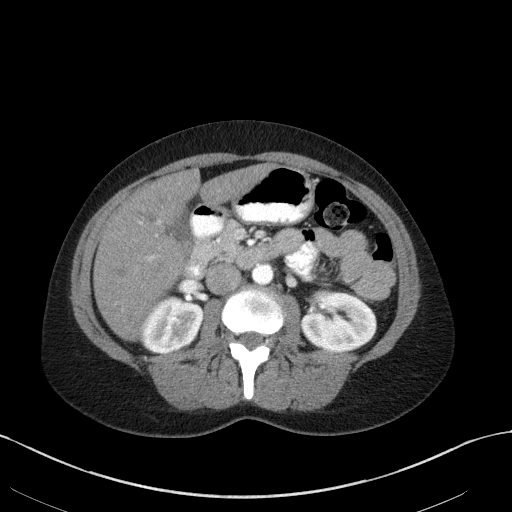
[im 68/89  soft-tissue]
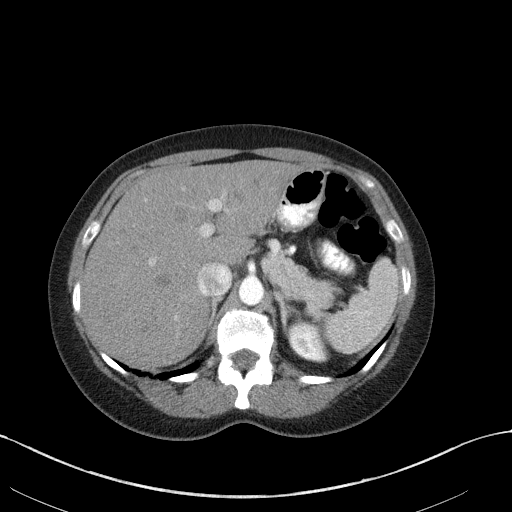
[im 73/89  soft-tissue]
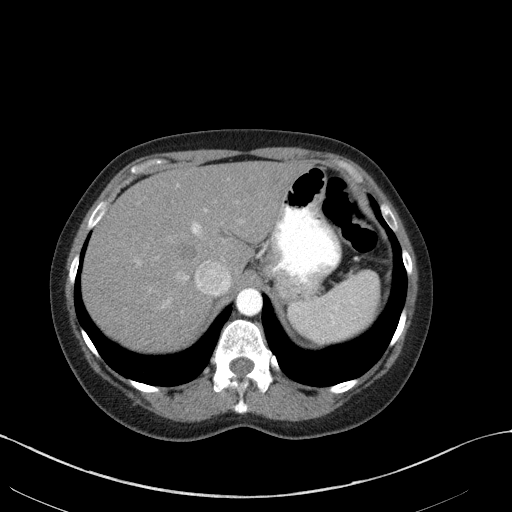
[im 78/89  soft-tissue]
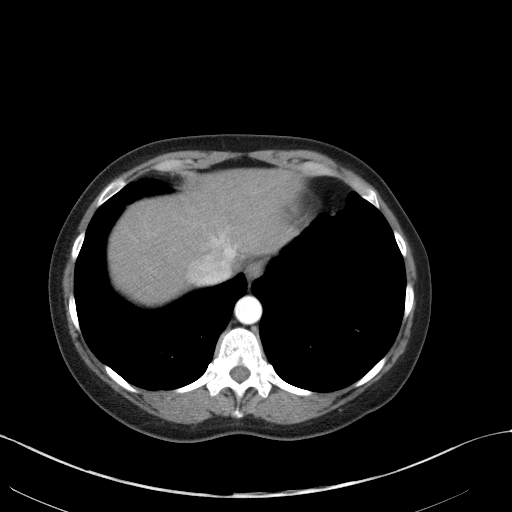
[im 83/89  soft-tissue]
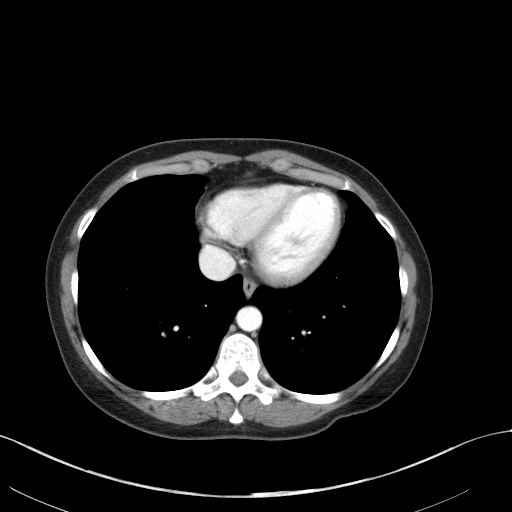

[Series 5: abd/pelvis 3.0 coronal · coronal · 0.82mm/px · 3 of 83 slices shown]
[im 28/83  soft-tissue]
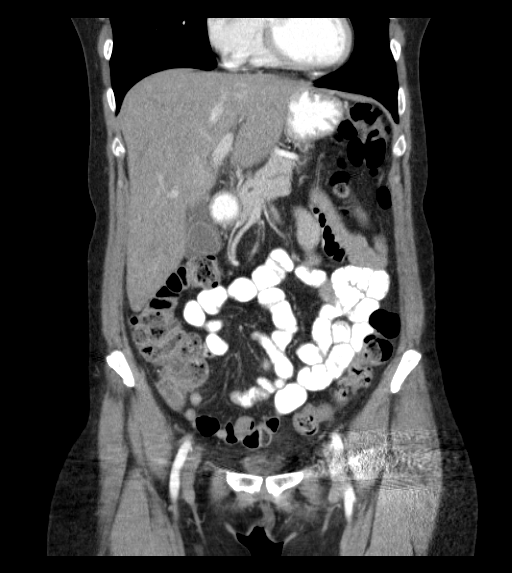
[im 37/83  soft-tissue]
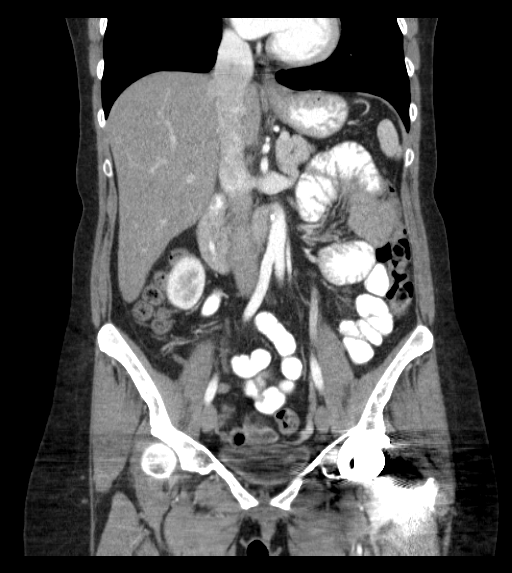
[im 46/83  soft-tissue]
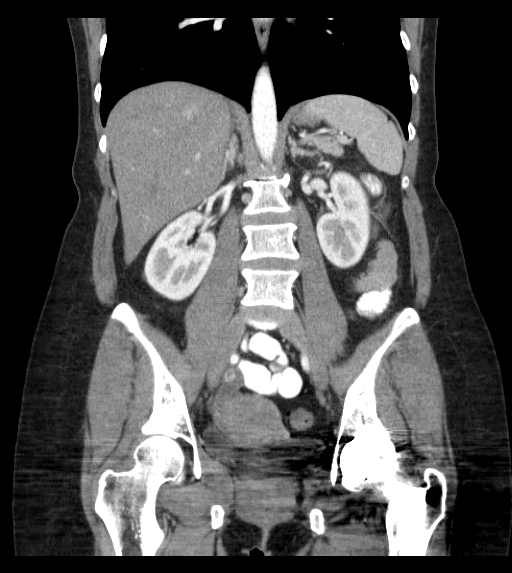

[17 of 46 positions shown; findings below may reference images not displayed]

FINDINGS: The lung bases are clear.  No pleural or pericardial effusion.

The gallbladder wall appears mildly thickened. No stones are visible
by CT scan. The liver is low attenuating consistent with fatty
infiltration. No focal lesion or biliary ductal dilatation. The
adrenal glands, spleen, pancreas and kidneys all appear normal.

The stomach, small and large bowel and appendix are unremarkable.
Small amount of free pelvic fluid is consistent with physiologic
change. Uterus, adnexa and urinary bladder appear normal. No
lymphadenopathy is identified.

The patient is status post left total hip replacement. No lytic or
sclerotic bony lesion is identified.
IMPRESSION: Gallbladder wall thickening is nonspecific but could be due to
cholecystitis. Recommend correlation with right upper quadrant
abdominal ultrasound.

Fatty infiltration of the liver.
